# Patient Record
Sex: Male | Born: 1935 | Race: White | Hispanic: No | Smoking: Never smoker
Health system: Southern US, Community
[De-identification: ages and names within clinical notes are randomized; demographics above are authoritative.]

## PROBLEM LIST (undated history)

## (undated) DIAGNOSIS — M109 Gout, unspecified: Secondary | ICD-10-CM

## (undated) DIAGNOSIS — I10 Essential (primary) hypertension: Secondary | ICD-10-CM

## (undated) HISTORY — PX: KNEE SURGERY: SHX244

## (undated) HISTORY — PX: COLON SURGERY: SHX602

---

## 2001-11-09 ENCOUNTER — Encounter: Payer: Self-pay | Admitting: General Surgery

## 2001-11-09 ENCOUNTER — Encounter: Admission: RE | Admit: 2001-11-09 | Discharge: 2001-11-09 | Payer: Self-pay | Admitting: General Surgery

## 2001-11-15 ENCOUNTER — Inpatient Hospital Stay (HOSPITAL_COMMUNITY): Admission: RE | Admit: 2001-11-15 | Discharge: 2001-11-21 | Payer: Self-pay | Admitting: General Surgery

## 2001-11-15 ENCOUNTER — Encounter (INDEPENDENT_AMBULATORY_CARE_PROVIDER_SITE_OTHER): Payer: Self-pay | Admitting: Specialist

## 2002-03-15 ENCOUNTER — Encounter: Admission: RE | Admit: 2002-03-15 | Discharge: 2002-03-15 | Payer: Self-pay | Admitting: Occupational Medicine

## 2002-03-15 ENCOUNTER — Encounter: Payer: Self-pay | Admitting: Occupational Medicine

## 2002-07-29 ENCOUNTER — Ambulatory Visit (HOSPITAL_COMMUNITY): Admission: RE | Admit: 2002-07-29 | Discharge: 2002-07-29 | Payer: Self-pay | Admitting: *Deleted

## 2002-07-29 ENCOUNTER — Encounter: Payer: Self-pay | Admitting: *Deleted

## 2002-11-18 ENCOUNTER — Encounter: Payer: Self-pay | Admitting: Orthopedic Surgery

## 2002-11-20 ENCOUNTER — Encounter: Payer: Self-pay | Admitting: Orthopedic Surgery

## 2002-11-20 ENCOUNTER — Inpatient Hospital Stay (HOSPITAL_COMMUNITY): Admission: RE | Admit: 2002-11-20 | Discharge: 2002-11-25 | Payer: Self-pay | Admitting: Orthopedic Surgery

## 2002-12-30 ENCOUNTER — Ambulatory Visit (HOSPITAL_COMMUNITY): Admission: RE | Admit: 2002-12-30 | Discharge: 2002-12-30 | Payer: Self-pay | Admitting: Oncology

## 2003-10-01 ENCOUNTER — Emergency Department (HOSPITAL_COMMUNITY): Admission: EM | Admit: 2003-10-01 | Discharge: 2003-10-01 | Payer: Self-pay | Admitting: Family Medicine

## 2003-11-20 ENCOUNTER — Ambulatory Visit (HOSPITAL_COMMUNITY): Admission: RE | Admit: 2003-11-20 | Discharge: 2003-11-20 | Payer: Self-pay | Admitting: Oncology

## 2004-11-24 ENCOUNTER — Ambulatory Visit: Payer: Self-pay | Admitting: Oncology

## 2004-12-02 ENCOUNTER — Ambulatory Visit (HOSPITAL_COMMUNITY): Admission: RE | Admit: 2004-12-02 | Discharge: 2004-12-02 | Payer: Self-pay | Admitting: Oncology

## 2005-11-23 ENCOUNTER — Ambulatory Visit: Payer: Self-pay | Admitting: Oncology

## 2005-11-25 LAB — CBC WITH DIFFERENTIAL/PLATELET
BASO%: 0.5 % (ref 0.0–2.0)
Basophils Absolute: 0 10*3/uL (ref 0.0–0.1)
EOS%: 3 % (ref 0.0–7.0)
Eosinophils Absolute: 0.2 10*3/uL (ref 0.0–0.5)
HCT: 46.1 % (ref 38.7–49.9)
HGB: 15.9 g/dL (ref 13.0–17.1)
LYMPH%: 25.2 % (ref 14.0–48.0)
MCH: 31.6 pg (ref 28.0–33.4)
MCHC: 34.6 g/dL (ref 32.0–35.9)
MCV: 91.4 fL (ref 81.6–98.0)
MONO#: 0.5 10*3/uL (ref 0.1–0.9)
MONO%: 9.1 % (ref 0.0–13.0)
NEUT#: 3.5 10*3/uL (ref 1.5–6.5)
NEUT%: 62.2 % (ref 40.0–75.0)
Platelets: 263 10*3/uL (ref 145–400)
RBC: 5.05 10*6/uL (ref 4.20–5.71)
RDW: 14.4 % (ref 11.2–14.6)
WBC: 5.7 10*3/uL (ref 4.0–10.0)
lymph#: 1.4 10*3/uL (ref 0.9–3.3)

## 2005-11-25 LAB — COMPREHENSIVE METABOLIC PANEL
ALT: 35 U/L (ref 0–40)
AST: 24 U/L (ref 0–37)
Albumin: 4.5 g/dL (ref 3.5–5.2)
Alkaline Phosphatase: 107 U/L (ref 39–117)
BUN: 21 mg/dL (ref 6–23)
CO2: 26 mEq/L (ref 19–32)
Calcium: 9.3 mg/dL (ref 8.4–10.5)
Chloride: 104 mEq/L (ref 96–112)
Creatinine, Ser: 1.48 mg/dL (ref 0.40–1.50)
Glucose, Bld: 109 mg/dL — ABNORMAL HIGH (ref 70–99)
Potassium: 4.7 mEq/L (ref 3.5–5.3)
Sodium: 140 mEq/L (ref 135–145)
Total Bilirubin: 0.4 mg/dL (ref 0.3–1.2)
Total Protein: 7.3 g/dL (ref 6.0–8.3)

## 2005-11-25 LAB — LACTATE DEHYDROGENASE: LDH: 166 U/L (ref 94–250)

## 2005-11-25 LAB — CEA: CEA: 2.6 ng/mL (ref 0.0–5.0)

## 2005-11-25 LAB — PSA: PSA: 1.15 ng/mL (ref 0.10–4.00)

## 2005-11-28 ENCOUNTER — Ambulatory Visit (HOSPITAL_COMMUNITY): Admission: RE | Admit: 2005-11-28 | Discharge: 2005-11-28 | Payer: Self-pay | Admitting: Oncology

## 2006-11-20 ENCOUNTER — Ambulatory Visit: Payer: Self-pay | Admitting: Oncology

## 2006-11-22 LAB — CBC WITH DIFFERENTIAL/PLATELET
BASO%: 0.4 % (ref 0.0–2.0)
Basophils Absolute: 0 10*3/uL (ref 0.0–0.1)
EOS%: 3.4 % (ref 0.0–7.0)
Eosinophils Absolute: 0.2 10*3/uL (ref 0.0–0.5)
HCT: 42.6 % (ref 38.7–49.9)
HGB: 15.1 g/dL (ref 13.0–17.1)
LYMPH%: 25.6 % (ref 14.0–48.0)
MCH: 31.7 pg (ref 28.0–33.4)
MCHC: 35.5 g/dL (ref 32.0–35.9)
MCV: 89.4 fL (ref 81.6–98.0)
MONO#: 0.3 10*3/uL (ref 0.1–0.9)
MONO%: 5.4 % (ref 0.0–13.0)
NEUT#: 3.2 10*3/uL (ref 1.5–6.5)
NEUT%: 65.2 % (ref 40.0–75.0)
Platelets: 249 10*3/uL (ref 145–400)
RBC: 4.76 10*6/uL (ref 4.20–5.71)
RDW: 14.3 % (ref 11.2–14.6)
WBC: 4.9 10*3/uL (ref 4.0–10.0)
lymph#: 1.3 10*3/uL (ref 0.9–3.3)

## 2006-11-22 LAB — COMPREHENSIVE METABOLIC PANEL
ALT: 27 U/L (ref 0–53)
AST: 19 U/L (ref 0–37)
Albumin: 4.4 g/dL (ref 3.5–5.2)
Alkaline Phosphatase: 109 U/L (ref 39–117)
BUN: 20 mg/dL (ref 6–23)
CO2: 21 mEq/L (ref 19–32)
Calcium: 9 mg/dL (ref 8.4–10.5)
Chloride: 105 mEq/L (ref 96–112)
Creatinine, Ser: 1.44 mg/dL (ref 0.40–1.50)
Glucose, Bld: 156 mg/dL — ABNORMAL HIGH (ref 70–99)
Potassium: 4.3 mEq/L (ref 3.5–5.3)
Sodium: 139 mEq/L (ref 135–145)
Total Bilirubin: 0.5 mg/dL (ref 0.3–1.2)
Total Protein: 7 g/dL (ref 6.0–8.3)

## 2006-11-22 LAB — CEA: CEA: 1.7 ng/mL (ref 0.0–5.0)

## 2006-11-22 LAB — LACTATE DEHYDROGENASE: LDH: 160 U/L (ref 94–250)

## 2006-11-24 ENCOUNTER — Ambulatory Visit (HOSPITAL_COMMUNITY): Admission: RE | Admit: 2006-11-24 | Discharge: 2006-11-24 | Payer: Self-pay | Admitting: Oncology

## 2006-11-29 LAB — FECAL OCCULT BLOOD, GUAIAC: Occult Blood: NEGATIVE

## 2010-07-02 NOTE — Op Note (Signed)
NAME:  Samuel Torres, Samuel Torres                          ACCOUNT NO.:  [id number]   MEDICAL RECORD NO.:  [id number]                   PATIENT TYPE:  INP   LOCATION:  0456                                 FACILITY:  [facility]   PHYSICIAN:  Georges Lynch. Darrelyn Hillock, M.D.             DATE OF BIRTH:  January 24, 1934   DATE OF PROCEDURE:  11/20/2002  DATE OF DISCHARGE:                                 OPERATIVE REPORT   SURGEON:  Georges Lynch. Darrelyn Hillock, M.D.   ASSISTANT:  Ebbie Ridge. Paitsel, P.A.   PREOPERATIVE DIAGNOSIS:  Severe degenerative arthritis of the right knee.   POSTOPERATIVE DIAGNOSIS:  Severe degenerative arthritis of the right knee.   OPERATION:  Right total knee arthroplasty, utilizing the [employer] system.  All three components were cemented.  We used vancomycin in the cement.  He  had 1 g of IV Ancef preop.   DESCRIPTION OF PROCEDURE:  Under general anesthesia, a routine orthopedic  prep and draping of the right lower extremity was carried out.  The leg was  exsanguinated and Esmarch'd and the tourniquet was elevated at 350 mmHg.  At  this time, an incision was made down the anterior aspect of the right leg.  At this time, the incision was carried down through the subcutaneous tissue  down to the quadriceps tendon.  At this time, we created two flaps, sutured  those in place.  I then carried out a median parapatellar incision.  We  reflected the patella laterally, flexed the knee, and did medial and lateral  meniscectomies and incised the anterior and posterior cruciate ligaments.  We then removed the spurs from the patella, tibia, and femur.  Initial drill  hole was made in the intercondylar notch, and then the guide rod was  inserted, and we removed 10 mm thickness off the distal femur.  Following  this, a #2 jig was inserted for a size 9 right posterior cruciate  sacrificing femoral component, and we did our anterior, posterior, and  chamfering cuts.  Following this, we then removed 4 mm thickness  off of the  medial aspect of the tibia with the intramedullary guide rod.  Following  this, we then made our groove cut for a size 9 right posterior cruciate  sacrificing femoral component, and we did our patellar groove cut.  Once  this done, we then removed 10 mm thickness off the articular surface of the  patella for a size 26 patella.  Three drill holes then were made in the  articular surface of the patella.  We then flexed the knee and cut our keel  cut out for a size 7 tibial tray.  Following this, we thoroughly waterpicked  out the knee, dried the knee out, and cemented all three components in  simultaneously.  Once again, vancomycin was inserted in the cement.  Once  the cement was dried, we made sure we removed all loose pieces of  cement.  Prior to going through our cementing process, we did go through the  appropriate trials.  After all three components were cemented, we went  through trials once again, first with a 10 mm thickness tibial insert,  second with a 12 mm tibial insert.  The 12 mm thickness tibial insert gave  [place] the most stable contour, so we decided to use that.  We then removed  that, waterpicked the knee out, and once again searched for loose pieces of  cement.  We then inserted our permanent 12 mm thickness size 7 tibial  inserted, reduced the knee, took the knee through motion, had excellent  stability.  We then inserted a Hemovac drain, closed the knee in layers in  the usual fashion.  Skin was closed with metal staples, and a sterile  Neosporin dressing was applied.  The patient left the operating room in  satisfactory condition.                                              Ronald A. Darrelyn Hillock, M.D.   RAG/MEDQ  D:  11/20/2002  T:  11/20/2002  Job:  [id number]

## 2010-07-02 NOTE — H&P (Signed)
NAME:  Samuel Torres, Samuel Torres                          ACCOUNT NO.:  [id number]   MEDICAL RECORD NO.:  [id number]                   PATIENT TYPE:  INP   LOCATION:  NA                                   FACILITY:  [facility]   PHYSICIAN:  Georges Lynch. Darrelyn Hillock, M.D.             DATE OF BIRTH:  1933-04-19   DATE OF ADMISSION:  DATE OF DISCHARGE:                                HISTORY & PHYSICAL   ANTICIPATED DATE OF ADMISSION:  November 20, 2002.   HISTORY:  The patient had had right knee pain since he sustained a fall last  year.  At the time of the fall, he sustained a spiral fracture to his left  distal femur.  The patient had a left total knee arthroplasty in 1999.  He  has been having increasing pain in his right knee since the fall.  The  patient has been unrelieved with Vioxx.  The patient has degenerative  arthritis in his right knee and has elected to proceed with a right total  knee arthroplasty.   ALLERGIES:  None known.   PRIMARY CARE PHYSICIAN:  Oley Balm. Georgina Pillion, M.D.   PAST MEDICAL HISTORY:  1. Gastroesophageal reflux disease.  2. Degenerative arthritis.  3. Gout.  4. Kidney stones.  5. History of colon cancer.   CURRENT MEDICATIONS:  1. Atenolol 50 mg daily.  2. Lotrel 10/20 mg daily.  3. Allopurinol 300 mg daily.  4. Protonix 40 mg daily.   FAMILY HISTORY:  The patient's father had cancer, type unknown.   PAST SURGICAL HISTORY:  1. Left total knee arthroplasty February 1999.  2. Colon surgery October 2003.   REVIEW OF SYSTEMS:  GENERAL:  Denies weight changes, fever, chills, fatigue.  HEENT:  Denies headache, visual changes, tinnitus, hearing loss, or sore  throat.  CARDIOVASCULAR:  Denies chest pain, palpitations, shortness of  breath, orthopnea.  PULMONARY:  Denies dyspnea, wheezing, cough, sputum  production, hemoptysis.  GI:  Denies dysphagia, nausea, vomiting,  hematemesis, or abdominal pain. GU:  Denies dysuria, frequency, urgency, or  hematuria.  ENDOCRINE:  Denies  polyuria, polydipsia, appetite change, heat  or cold intolerance.  MUSCULOSKELETAL:  The patient does have left knee  pain.  NEUROLOGICAL:  Denies dizziness, vertigo, syncope, or seizure.  SKIN:  Denies itching, rashes, masses, or moles.   PHYSICAL EXAMINATION:  VITAL SIGNS:  Temperature 97.5, pulse 80,  respirations 18, blood pressure 110/70 right arm sitting.  GENERAL:  A 77 year old male in no acute distress.  HEENT:  PERRL, EOMs intact, pharynx clear, TMs intact.  NECK:  Supple without masses.  CHEST:  Clear to auscultation bilaterally.  No wheezing, rales, or rhonchi  noted.  HEART:  Regular rate and rhythm without murmur, gallop, or rub.  ABDOMEN:  Positive bowel sounds.  Soft, nontender.  No organomegaly or  abnormal masses.  EXTREMITIES:  Examination of his right knee reveals decreased range of  motion.  He has genu varum deformity.  SKIN:  Warm and dry.   X-ray of his right knee shows complete collapse of the medial joint space.   IMPRESSION:  Degenerative arthritis right knee.   PLAN:  The patient is to be admitted to [facility] November 20, 2002  to undergo a right total knee arthroplasty.     Ebbie Ridge. Paitsel, P.A.                     Ronald A. Darrelyn Hillock, M.D.    Tilden Dome  D:  11/18/2002  T:  11/18/2002  Job:  [id number]

## 2010-07-02 NOTE — Discharge Summary (Signed)
NAME:  ASIM, GERSTEN                          ACCOUNT NO.:  [id number]   MEDICAL RECORD NO.:  [id number]                   PATIENT TYPE:  INP   LOCATION:  0456                                 FACILITY:  [facility]   PHYSICIAN:  Georges Lynch. Darrelyn Hillock, M.D.             DATE OF BIRTH:  [birth year]   DATE OF ADMISSION:  11/20/2002  DATE OF DISCHARGE:  11/25/2002                                 DISCHARGE SUMMARY   ADMISSION DIAGNOSES:  1. Degenerative arthritis right knee.  2. Gastroesophageal reflux disease.  3. Gout.  4. Kidney stones.  5. History of colon cancer.   DISCHARGE DIAGNOSES:  1. Degenerative arthritis right knee status post right total knee     arthroplasty.  2. Gastroesophageal reflux disease.  3. Gout.  4. Kidney stones.  5. History of colon cancer.   PROCEDURE:  The patient was taken to the operating room on November 20, 2002  to undergo a right total knee arthroplasty.  Surgeon Georges Lynch. Darrelyn Hillock, M.D.  Assistant Ebbie Ridge. Paitsel, P.A.  Surgery was performed under general  anesthesia.  A Hemovac drain was placed at the time of surgery.   CONSULTS:  Physical therapy, occupational therapy.   BRIEF HISTORY:  This patient is a 77 year old male who has had right knee  pain x1 year.  He had a fall last year sustaining a spiral fracture of his  left distal femur.  The patient had a left total knee arthroplasty in 1999.  He had been having increasing pain in his right knee since the time of the  fall.  The patient's symptoms have been unrelieved with Vioxx.  The patient  has severe degenerative arthritis in his right knee and has elected to  proceed with a right total knee arthroplasty.   LABORATORY DATA:  Preadmission CBC:  WBC 5, RBC 4.80, hemoglobin 14.6,  hematocrit 42.6, platelet count 274,000.  Preadmission PT 12.8, INR 0.9, PTT  34.  Preadmission chemistry normal.  Preadmission urinalysis normal.  The  patient's blood type is A+ with negative antibody screen.   Preadmission EKG:  Normal sinus rhythm with a rate of 77.  Preadmission chest x-ray was normal.  Preadmission x-ray of right knee revealed degenerative arthritic changes  with marked medial and lateral compartment narrowing, complete loss of his  normal medial joint space.  Postoperative x-ray of right knee:  Right knee  prosthesis in expected position.  Hemoglobin and hematocrit were followed  throughout his hospitalization.  He did have a postoperative drop in his  hemoglobin to 9.4 but that stabilized prior to discharge and he did not  require blood transfusion.   HOSPITAL COURSE:  The patient was admitted to [facility] and taken  to the operating room.  He underwent the above stated procedure without  complications.  The patient tolerated the procedure well.  Was allowed to  return to the recovery room and then to  the orthopedic floor to continue his  postoperative care.  Hemovac drain was placed at time of surgery.  It was  discontinued on postoperative day one.  He was placed on PC analgesia for  pain control.  Following the surgery his PCA was kept until postoperative  day three after he was weaned over to oral analgesics.  The patient's  hemoglobin and hematocrit were followed throughout his hospitalization.  He  had a slight decrease in his hemoglobin postoperatively but that stabilized  prior to discharge.  The patient progressed very well.  Was stable to  ambulate greater than 100 feet by postoperative day five.  He was discharged  home.   DISPOSITION:  The patient was discharged home on November 25, 2002.   DISCHARGE MEDICATIONS:  1. Percocet 10/650 one to two q.6h. as needed for pain.  2. Robaxin 500 mg one q.6h. as needed for muscle spasms.  3. Trinsicon one tablet daily.  4. Coumadin 5 mg one tablet daily.  5. The patient is to resume his routine medications.   DIET:  As tolerated.   ACTIVITY:  Total knee precautions.  [facility] for home care.  Full   weightbearing with walker.   FOLLOWUP:  The patient is scheduled to follow up with Windy Fast A. Gioffre,  M.D. two weeks from the date of surgery.   CONDITION ON DISCHARGE:  Improved.     Ebbie Ridge. Paitsel, P.A.                     Ronald A. Darrelyn Hillock, M.D.    [id number]/[id number]  D:  12/18/2002  T:  12/18/2002  Job:  [id number]

## 2013-09-26 ENCOUNTER — Other Ambulatory Visit: Payer: Self-pay | Admitting: Internal Medicine

## 2013-09-26 DIAGNOSIS — N183 Chronic kidney disease, stage 3 unspecified: Secondary | ICD-10-CM

## 2013-09-27 ENCOUNTER — Ambulatory Visit
Admission: RE | Admit: 2013-09-27 | Discharge: 2013-09-27 | Disposition: A | Discharge status: Home / Self Care | Payer: Medicare HMO | Source: Ambulatory Visit | Attending: Internal Medicine | Admitting: Internal Medicine

## 2013-09-27 DIAGNOSIS — N183 Chronic kidney disease, stage 3 unspecified: Secondary | ICD-10-CM

## 2013-10-30 ENCOUNTER — Ambulatory Visit
Admission: RE | Admit: 2013-10-30 | Discharge: 2013-10-30 | Disposition: A | Discharge status: Home / Self Care | Payer: Medicare HMO | Source: Ambulatory Visit | Attending: Internal Medicine | Admitting: Internal Medicine

## 2013-10-30 ENCOUNTER — Other Ambulatory Visit: Payer: Self-pay | Admitting: Internal Medicine

## 2013-10-30 DIAGNOSIS — R05 Cough: Secondary | ICD-10-CM

## 2013-10-30 DIAGNOSIS — R059 Cough, unspecified: Secondary | ICD-10-CM

## 2013-11-24 ENCOUNTER — Encounter (HOSPITAL_COMMUNITY): Payer: Self-pay | Admitting: Emergency Medicine

## 2013-11-24 ENCOUNTER — Emergency Department (HOSPITAL_COMMUNITY)
Admission: EM | Admit: 2013-11-24 | Discharge: 2013-11-24 | Disposition: A | Discharge status: Home / Self Care | Payer: Medicare HMO | Attending: Emergency Medicine | Admitting: Emergency Medicine

## 2013-11-24 DIAGNOSIS — I10 Essential (primary) hypertension: Secondary | ICD-10-CM | POA: Insufficient documentation

## 2013-11-24 DIAGNOSIS — Z792 Long term (current) use of antibiotics: Secondary | ICD-10-CM | POA: Diagnosis not present

## 2013-11-24 DIAGNOSIS — M109 Gout, unspecified: Secondary | ICD-10-CM | POA: Insufficient documentation

## 2013-11-24 DIAGNOSIS — Z7982 Long term (current) use of aspirin: Secondary | ICD-10-CM | POA: Insufficient documentation

## 2013-11-24 DIAGNOSIS — N390 Urinary tract infection, site not specified: Secondary | ICD-10-CM | POA: Diagnosis not present

## 2013-11-24 DIAGNOSIS — Z79899 Other long term (current) drug therapy: Secondary | ICD-10-CM | POA: Insufficient documentation

## 2013-11-24 DIAGNOSIS — R3915 Urgency of urination: Secondary | ICD-10-CM | POA: Diagnosis present

## 2013-11-24 DIAGNOSIS — R339 Retention of urine, unspecified: Secondary | ICD-10-CM | POA: Diagnosis not present

## 2013-11-24 HISTORY — DX: Essential (primary) hypertension: I10

## 2013-11-24 HISTORY — DX: Gout, unspecified: M10.9

## 2013-11-24 LAB — URINALYSIS, ROUTINE W REFLEX MICROSCOPIC
Bilirubin Urine: NEGATIVE
GLUCOSE, UA: NEGATIVE mg/dL
HGB URINE DIPSTICK: NEGATIVE
KETONES UR: NEGATIVE mg/dL
Nitrite: NEGATIVE
PH: 5.5 (ref 5.0–8.0)
PROTEIN: NEGATIVE mg/dL
Specific Gravity, Urine: 1.013 (ref 1.005–1.030)
Urobilinogen, UA: 0.2 mg/dL (ref 0.0–1.0)

## 2013-11-24 LAB — CBC WITH DIFFERENTIAL/PLATELET
Basophils Absolute: 0 10*3/uL (ref 0.0–0.1)
Basophils Relative: 0 % (ref 0–1)
EOS ABS: 0.1 10*3/uL (ref 0.0–0.7)
Eosinophils Relative: 1 % (ref 0–5)
HCT: 35.2 % — ABNORMAL LOW (ref 39.0–52.0)
Hemoglobin: 12 g/dL — ABNORMAL LOW (ref 13.0–17.0)
Lymphocytes Relative: 14 % (ref 12–46)
Lymphs Abs: 1.2 10*3/uL (ref 0.7–4.0)
MCH: 31.3 pg (ref 26.0–34.0)
MCHC: 34.1 g/dL (ref 30.0–36.0)
MCV: 91.7 fL (ref 78.0–100.0)
MONOS PCT: 7 % (ref 3–12)
Monocytes Absolute: 0.6 10*3/uL (ref 0.1–1.0)
Neutro Abs: 6.4 10*3/uL (ref 1.7–7.7)
Neutrophils Relative %: 78 % — ABNORMAL HIGH (ref 43–77)
PLATELETS: 272 10*3/uL (ref 150–400)
RBC: 3.84 MIL/uL — ABNORMAL LOW (ref 4.22–5.81)
RDW: 14.7 % (ref 11.5–15.5)
WBC: 8.3 10*3/uL (ref 4.0–10.5)

## 2013-11-24 LAB — BASIC METABOLIC PANEL
Anion gap: 14 (ref 5–15)
BUN: 22 mg/dL (ref 6–23)
CALCIUM: 9.3 mg/dL (ref 8.4–10.5)
CO2: 22 mEq/L (ref 19–32)
Chloride: 95 mEq/L — ABNORMAL LOW (ref 96–112)
Creatinine, Ser: 1.49 mg/dL — ABNORMAL HIGH (ref 0.50–1.35)
GFR calc Af Amer: 50 mL/min — ABNORMAL LOW (ref >90)
GFR, EST NON AFRICAN AMERICAN: 44 mL/min — AB (ref >90)
GLUCOSE: 114 mg/dL — AB (ref 70–99)
Potassium: 4.2 mEq/L (ref 3.7–5.3)
SODIUM: 131 meq/L — AB (ref 137–147)

## 2013-11-24 LAB — URINE MICROSCOPIC-ADD ON

## 2013-11-24 MED ORDER — SODIUM CHLORIDE 0.9 % IV BOLUS (SEPSIS)
500.0000 mL | Freq: Once | INTRAVENOUS | Status: AC
  Administered 2013-11-24: 500 mL via INTRAVENOUS

## 2013-11-24 MED ORDER — SODIUM CHLORIDE 0.9 % IV BOLUS (SEPSIS)
250.0000 mL | Freq: Once | INTRAVENOUS | Status: AC
  Administered 2013-11-24: 250 mL via INTRAVENOUS

## 2013-11-24 MED ORDER — TAMSULOSIN HCL 0.4 MG PO CAPS: 0.4000 mg | ORAL_CAPSULE | Freq: Every day | ORAL | Status: AC

## 2013-11-24 MED ORDER — DEXTROSE 5 % IV SOLN
1.0000 g | Freq: Once | INTRAVENOUS | Status: AC
  Administered 2013-11-24: 1 g via INTRAVENOUS
  Filled 2013-11-24: qty 10

## 2013-11-24 NOTE — ED Notes (Signed)
Pt has attempted using urinal multiple times without success.

## 2013-11-24 NOTE — ED Provider Notes (Signed)
CSN: [id number]     Arrival date & time 11/24/13  3086 History   First MD Initiated Contact with Patient 11/24/13 1025     Chief Complaint  Patient presents with  . Urinary Urgency     (Consider location/radiation/quality/duration/timing/severity/associated sxs/prior Treatment) HPI Comments: The patient presents to the ER for evaluation of urinary symptoms. Patient reports that he developed a "kidney infection" a month ago. He was treated with antibiotics. Patient reports that the symptoms have come back this week. He was started on Keflex several days ago. He reports that he had some urgency, frequency, dysuria last night and then starting at 5 AM he has not been able to pass any urine. Has not had any fever, nausea or vomiting. He has very slight pain in the left side of his back.   Past Medical History  Diagnosis Date  . Hypertension   . Gout    Past Surgical History  Procedure Laterality Date  . Colon surgery    . Knee surgery Bilateral    History reviewed. No pertinent family history. History  Substance Use Topics  . Smoking status: Never Smoker   . Smokeless tobacco: Not on file  . Alcohol Use: No    Review of Systems  Genitourinary: Positive for dysuria, urgency, frequency and decreased urine volume. Negative for testicular pain.  All other systems reviewed and are negative.     Allergies  Review of patient's allergies indicates no known allergies.  Home Medications   Prior to Admission medications   Medication Sig Start Date End Date Taking? Authorizing Provider  acetaminophen (TYLENOL) 325 MG tablet Take 650 mg by mouth every 6 (six) hours as needed.   Yes Historical Provider, MD  allopurinol (ZYLOPRIM) 300 MG tablet Take 300 mg by mouth daily. 09/26/13  Yes Historical Provider, MD  amLODipine (NORVASC) 10 MG tablet Take 10 mg by mouth daily.   Yes Historical Provider, MD  aspirin EC 81 MG tablet Take 81 mg by mouth daily.   Yes Historical Provider, MD   atenolol (TENORMIN) 50 MG tablet Take 50 mg by mouth daily. 10/21/13  Yes Historical Provider, MD  benazepril-hydrochlorthiazide (LOTENSIN HCT) 20-12.5 MG per tablet Take 1 tablet by mouth daily. 10/21/13  Yes Historical Provider, MD  cephALEXin (KEFLEX) 500 MG capsule Take 500 mg by mouth 2 (two) times daily. 11/21/13  Yes Historical Provider, MD  pantoprazole (PROTONIX) 40 MG tablet Take 40 mg by mouth daily. 09/26/13  Yes Historical Provider, MD   BP 148/83  Pulse 103  Temp(Src) 98.4 F (36.9 C) (Oral)  Resp 16  Ht 5\' 9"  (1.753 m)  Wt 198 lb (89.812 kg)  BMI 29.23 kg/m2  SpO2 98% Physical Exam  Constitutional: He is oriented to person, place, and time. He appears well-developed and well-nourished. No distress.  HENT:  Head: Normocephalic and atraumatic.  Right Ear: Hearing normal.  Left Ear: Hearing normal.  Nose: Nose normal.  Mouth/Throat: Oropharynx is clear and moist and mucous membranes are normal.  Eyes: Conjunctivae and EOM are normal. Pupils are equal, round, and reactive to light.  Neck: Normal range of motion. Neck supple.  Cardiovascular: Regular rhythm, S1 normal and S2 normal.  Exam reveals no gallop and no friction rub.   No murmur heard. Pulmonary/Chest: Effort normal and breath sounds normal. No respiratory distress. He exhibits no tenderness.  Abdominal: Soft. Normal appearance and bowel sounds are normal. There is no hepatosplenomegaly. There is tenderness (slight) in the suprapubic area. There is no rebound,  no guarding, no tenderness at McBurney's point and negative Murphy's sign. No hernia.  Musculoskeletal: Normal range of motion.  Neurological: He is alert and oriented to person, place, and time. He has normal strength. No cranial nerve deficit or sensory deficit. Coordination normal. GCS eye subscore is 4. GCS verbal subscore is 5. GCS motor subscore is 6.  Skin: Skin is warm, dry and intact. No rash noted. No cyanosis.  Psychiatric: He has a normal mood and  affect. His speech is normal and behavior is normal. Thought content normal.    ED Course  Procedures (including critical care time) Labs Review Labs Reviewed  CBC WITH DIFFERENTIAL - Abnormal; Notable for the following:    RBC 3.84 (*)    Hemoglobin 12.0 (*)    HCT 35.2 (*)    Neutrophils Relative % 78 (*)    All other components within normal limits  BASIC METABOLIC PANEL - Abnormal; Notable for the following:    Sodium 131 (*)    Chloride 95 (*)    Glucose, Bld 114 (*)    Creatinine, Ser 1.49 (*)    GFR calc non Af Amer 44 (*)    GFR calc Af Amer 50 (*)    All other components within normal limits  URINALYSIS, ROUTINE W REFLEX MICROSCOPIC - Abnormal; Notable for the following:    Leukocytes, UA TRACE (*)    All other components within normal limits  URINE CULTURE  URINE MICROSCOPIC-ADD ON    Imaging Review No results found.   EKG Interpretation None      MDM   Final diagnoses:  None   UTI  urinary retention  Patient presented to the ER with concerns over possible urinary retention. Patient reports that he had urinary frequency through the night and was recently diagnosed with a urinary tract infection. He was started on Keflex. Since this morning, however, he has been unable to pass any urine. His bladder scan, however, showed only 45 mL b. It was felt that he was having bladder spasms secondary to infection. Was administered IV Rocephin and IV fluids. Patient has been unable to pass any urine. A second bladder scan now shows 600 mL of urine in his bladder. A Foley catheter will be placed. Patient will be started on Flomax. Follow up with urology in the office. Continue Keflex.    [name], MD 11/24/13 1655

## 2013-11-24 NOTE — Discharge Instructions (Signed)
Acute Urinary Retention °Acute urinary retention is the temporary inability to urinate. °This is a common problem in older men. As men age their prostates become larger and block the flow of urine from the bladder. This is usually a problem that has come on gradually.  °HOME CARE INSTRUCTIONS °If you are sent home with a Foley catheter and a drainage system, you will need to discuss the best course of action with your health care provider. While the catheter is in, maintain a good intake of fluids. Keep the drainage bag emptied and lower than your catheter. This is so that contaminated urine will not flow back into your bladder, which could lead to a urinary tract infection. °There are two main types of drainage bags. One is a large bag that usually is used at night. It has a good capacity that will allow you to sleep through the night without having to empty it. The second type is called a leg bag. It has a smaller capacity, so it needs to be emptied more frequently. However, the main advantage is that it can be attached by a leg strap and can go underneath your clothing, allowing you the freedom to move about or leave your home. °Only take over-the-counter or prescription medicines for pain, discomfort, or fever as directed by your health care provider.  °SEEK MEDICAL CARE IF: °· You develop a low-grade fever. °· You experience spasms or leakage of urine with the spasms. °SEEK IMMEDIATE MEDICAL CARE IF:  °· You develop chills or fever. °· Your catheter stops draining urine. °· Your catheter falls out. °· You start to develop increased bleeding that does not respond to rest and increased fluid intake. °MAKE SURE YOU: °· Understand these instructions. °· Will watch your condition. °· Will get help right away if you are not doing well or get worse. °Document Released: 05/09/2000 Document Revised: 02/05/2013 Document Reviewed: 07/12/2012 °ExitCare® Patient Information ©2015 [employer]. This information is not  intended to replace advice given to you by your health care provider. Make sure you discuss any questions you have with your health care provider. °Urinary Tract Infection °Urinary tract infections (UTIs) can develop anywhere along your urinary tract. Your urinary tract is your body's drainage system for removing wastes and extra water. Your urinary tract includes two kidneys, two ureters, a bladder, and a urethra. Your kidneys are a pair of bean-shaped organs. Each kidney is about the size of your fist. They are located below your ribs, one on each side of your spine. °CAUSES °Infections are caused by microbes, which are microscopic organisms, including fungi, viruses, and bacteria. These organisms are so small that they can only be seen through a microscope. Bacteria are the microbes that most commonly cause UTIs. °SYMPTOMS  °Symptoms of UTIs may vary by age and gender of the patient and by the location of the infection. Symptoms in young women typically include a frequent and intense urge to urinate and a painful, burning feeling in the bladder or urethra during urination. Older women and men are more likely to be tired, shaky, and weak and have muscle aches and abdominal pain. A fever may mean the infection is in your kidneys. Other symptoms of a kidney infection include pain in your back or sides below the ribs, nausea, and vomiting. °DIAGNOSIS °To diagnose a UTI, your caregiver will ask you about your symptoms. Your caregiver also will ask to provide a urine sample. The urine sample will be tested for bacteria and white blood   cells. White blood cells are made by your body to help fight infection. °TREATMENT  °Typically, UTIs can be treated with medication. Because most UTIs are caused by a bacterial infection, they usually can be treated with the use of antibiotics. The choice of antibiotic and length of treatment depend on your symptoms and the type of bacteria causing your infection. °HOME CARE  INSTRUCTIONS °· If you were prescribed antibiotics, take them exactly as your caregiver instructs you. Finish the medication even if you feel better after you have only taken some of the medication. °· Drink enough water and fluids to keep your urine clear or pale yellow. °· Avoid caffeine, tea, and carbonated beverages. They tend to irritate your bladder. °· Empty your bladder often. Avoid holding urine for long periods of time. °· Empty your bladder before and after sexual intercourse. °· After a bowel movement, women should cleanse from front to back. Use each tissue only once. °SEEK MEDICAL CARE IF:  °· You have back pain. °· You develop a fever. °· Your symptoms do not begin to resolve within 3 days. °SEEK IMMEDIATE MEDICAL CARE IF:  °· You have severe back pain or lower abdominal pain. °· You develop chills. °· You have nausea or vomiting. °· You have continued burning or discomfort with urination. °MAKE SURE YOU:  °· Understand these instructions. °· Will watch your condition. °· Will get help right away if you are not doing well or get worse. °Document Released: 11/10/2004 Document Revised: 08/02/2011 Document Reviewed: 03/11/2011 °ExitCare® Patient Information ©2015 [employer]. This information is not intended to replace advice given to you by your health care provider. Make sure you discuss any questions you have with your health care provider. ° °

## 2013-11-24 NOTE — ED Notes (Signed)
Last night up to urinate frequently all night long; recent bladder infection, currently on antibiotics; small amounts of urine coming out each time. Denies fever, N/V. Reports some back pain on left side.

## 2013-11-24 NOTE — ED Notes (Addendum)
Bladder scan shows 61ml in bladder. Reported to Dr. Waverly Ferrari.

## 2013-11-24 NOTE — ED Notes (Signed)
Follow up and medications reviewed with pt. Pt verbalized understanding.

## 2013-11-26 LAB — URINE CULTURE
CULTURE: NO GROWTH
Colony Count: NO GROWTH

## 2014-02-26 ENCOUNTER — Other Ambulatory Visit (INDEPENDENT_AMBULATORY_CARE_PROVIDER_SITE_OTHER): Payer: Self-pay | Admitting: General Surgery

## 2014-02-26 ENCOUNTER — Other Ambulatory Visit (HOSPITAL_COMMUNITY): Payer: Self-pay | Admitting: *Deleted

## 2014-02-26 NOTE — Pre-Procedure Instructions (Addendum)
Samuel Torres  02/26/2014   Your procedure is scheduled on:  Friday, March 07, 2014 at 9:30 AM.   Report to [facility] Entrance "A" Admitting Office at 7:30 AM.   Call this number if you have problems the morning of surgery: [phone]               Any questions prior to day of surgery, please call [phone] between 8 & 4 PM    Remember:   Do not eat food or drink liquids after midnight Thursday,03/06/14.    Stop aspirin as of today.    Take these medicines the morning of surgery with A SIP OF WATER: acetaminophen (TYLENOL),  allopurinol (ZYLOPRIM), amLODipine (NORVASC), atenolol (TENORMIN) , pantoprazole  (PROTONIX), tamsulosin (FLOMAX)   DISCONTINUE ASPIRIN, COUMADIN, EFFIENT, HERBAL MEDICATIONS 5 DAYS PRIOR TO  SURGERY    Do not wear jewelry.  Do not wear lotions, powders, or cologne. You may wear deodorant.  Men may shave face and neck.  Do not bring valuables to the hospital.  [facility] is not responsible for any belongings or  valuables.               Contacts, dentures or bridgework may not be worn into surgery.  Leave suitcase in the car. After surgery it may be brought to your room.  For patients admitted to the hospital, discharge time is determined by your  treatment team.               Patients discharged the day of surgery will not be allowed to drive home.    Special Instructions: See "Preparing for Surgery" Instruction Sheet   Please read over the following fact sheets that you were given: Pain Booklet, Coughing and Deep Breathing and Surgical Site Infection Prevention

## 2014-02-26 NOTE — Progress Notes (Signed)
No pre-op orders in EPIC. Called Dr. Ethlyn Gallery office and spoke with Kenney Houseman, she states she will send Dr. Marlou Starks a message.

## 2014-02-27 ENCOUNTER — Encounter (HOSPITAL_COMMUNITY)
Admission: RE | Admit: 2014-02-27 | Discharge: 2014-02-27 | Disposition: A | Discharge status: Home / Self Care | Payer: Medicare HMO | Source: Ambulatory Visit | Attending: General Surgery | Admitting: General Surgery

## 2014-02-27 ENCOUNTER — Encounter (HOSPITAL_COMMUNITY): Payer: Self-pay

## 2014-02-27 DIAGNOSIS — Z01812 Encounter for preprocedural laboratory examination: Secondary | ICD-10-CM | POA: Diagnosis present

## 2014-02-27 DIAGNOSIS — K4090 Unilateral inguinal hernia, without obstruction or gangrene, not specified as recurrent: Secondary | ICD-10-CM | POA: Insufficient documentation

## 2014-02-27 DIAGNOSIS — Z01818 Encounter for other preprocedural examination: Secondary | ICD-10-CM | POA: Diagnosis not present

## 2014-02-27 LAB — CBC
HCT: 42.4 % (ref 39.0–52.0)
Hemoglobin: 14.3 g/dL (ref 13.0–17.0)
MCH: 31 pg (ref 26.0–34.0)
MCHC: 33.7 g/dL (ref 30.0–36.0)
MCV: 92 fL (ref 78.0–100.0)
PLATELETS: 192 10*3/uL (ref 150–400)
RBC: 4.61 MIL/uL (ref 4.22–5.81)
RDW: 14.3 % (ref 11.5–15.5)
WBC: 5.8 10*3/uL (ref 4.0–10.5)

## 2014-02-27 LAB — URINALYSIS, ROUTINE W REFLEX MICROSCOPIC
Bilirubin Urine: NEGATIVE
Glucose, UA: NEGATIVE mg/dL
Hgb urine dipstick: NEGATIVE
KETONES UR: NEGATIVE mg/dL
Leukocytes, UA: NEGATIVE
Nitrite: NEGATIVE
PH: 5.5 (ref 5.0–8.0)
PROTEIN: NEGATIVE mg/dL
SPECIFIC GRAVITY, URINE: 1.012 (ref 1.005–1.030)
UROBILINOGEN UA: 0.2 mg/dL (ref 0.0–1.0)

## 2014-02-27 LAB — BASIC METABOLIC PANEL
ANION GAP: 17 — AB (ref 5–15)
BUN: 14 mg/dL (ref 6–23)
CO2: 24 mmol/L (ref 19–32)
CREATININE: 1.62 mg/dL — AB (ref 0.50–1.35)
Calcium: 9.6 mg/dL (ref 8.4–10.5)
Chloride: 98 mEq/L (ref 96–112)
GFR, EST AFRICAN AMERICAN: 45 mL/min — AB (ref >90)
GFR, EST NON AFRICAN AMERICAN: 39 mL/min — AB (ref >90)
Glucose, Bld: 118 mg/dL — ABNORMAL HIGH (ref 70–99)
Potassium: 4.6 mmol/L (ref 3.5–5.1)
SODIUM: 139 mmol/L (ref 135–145)

## 2014-02-27 MED ORDER — CHLORHEXIDINE GLUCONATE 4 % EX LIQD: 1.0000 "application " | Freq: Once | CUTANEOUS | Status: DC

## 2014-02-27 NOTE — Progress Notes (Signed)
Requested EKG from Dr Shelia Media office.  Samuel Torres to fax document to [phone]

## 2014-02-27 NOTE — Progress Notes (Signed)
This patient scored at an elevated risk for obstructive sleep apnea using the STOP BANG TOOL during a presurgical testing,

## 2014-02-28 NOTE — Progress Notes (Signed)
Anesthesia Chart Review:  Pt is 81 year old male scheduled for L incarcerated inguinal hernia repair with mesh on 03/07/2014 with Dr. Marlou Starks.   PMH includes: HTN  Preoperative labs reviewed.  Cr 1.62. Was 1.49 11/2013, 1.44-1.48 in 2007, 2008.   Chest x-ray 10/30/2013 reviewed. No active cardiopulmonary disease.   EKG 11/27/2013: Sinus rhythm. Lateral T wave changes, consider ischemia.   Discussed with Dr. Oletta Lamas.  If no changes, I anticipate pt can proceed with surgery as scheduled.   Willeen Cass, FNP-BC [facility]/Anesthesiology Phone: [phone] 02/28/2014 4:46 PM

## 2014-03-06 MED ORDER — CEFAZOLIN SODIUM-DEXTROSE 2-3 GM-% IV SOLR
2.0000 g | INTRAVENOUS | Status: AC
  Administered 2014-03-07: 2 g via INTRAVENOUS
  Filled 2014-03-06: qty 50

## 2014-03-07 ENCOUNTER — Ambulatory Visit (HOSPITAL_COMMUNITY): Payer: Medicare HMO | Admitting: Anesthesiology

## 2014-03-07 ENCOUNTER — Encounter (HOSPITAL_COMMUNITY)
Admission: RE | Disposition: A | Discharge status: Home / Self Care | Payer: Self-pay | Source: Ambulatory Visit | Attending: General Surgery

## 2014-03-07 ENCOUNTER — Ambulatory Visit (HOSPITAL_COMMUNITY): Payer: Medicare HMO | Admitting: Emergency Medicine

## 2014-03-07 ENCOUNTER — Ambulatory Visit (HOSPITAL_COMMUNITY)
Admission: RE | Admit: 2014-03-07 | Discharge: 2014-03-07 | Disposition: A | Discharge status: Home / Self Care | Payer: Medicare HMO | Source: Ambulatory Visit | Attending: General Surgery | Admitting: General Surgery

## 2014-03-07 ENCOUNTER — Encounter (HOSPITAL_COMMUNITY): Payer: Self-pay | Admitting: *Deleted

## 2014-03-07 DIAGNOSIS — R339 Retention of urine, unspecified: Secondary | ICD-10-CM | POA: Insufficient documentation

## 2014-03-07 DIAGNOSIS — K4030 Unilateral inguinal hernia, with obstruction, without gangrene, not specified as recurrent: Secondary | ICD-10-CM | POA: Insufficient documentation

## 2014-03-07 DIAGNOSIS — Z85038 Personal history of other malignant neoplasm of large intestine: Secondary | ICD-10-CM | POA: Diagnosis not present

## 2014-03-07 DIAGNOSIS — I10 Essential (primary) hypertension: Secondary | ICD-10-CM | POA: Insufficient documentation

## 2014-03-07 DIAGNOSIS — K219 Gastro-esophageal reflux disease without esophagitis: Secondary | ICD-10-CM | POA: Diagnosis not present

## 2014-03-07 HISTORY — PX: INSERTION OF MESH: SHX5868

## 2014-03-07 HISTORY — PX: INGUINAL HERNIA REPAIR: SHX194

## 2014-03-07 SURGERY — REPAIR, HERNIA, INGUINAL, INCARCERATED
Anesthesia: General | Laterality: Left

## 2014-03-07 MED ORDER — VECURONIUM BROMIDE 10 MG IV SOLR
INTRAVENOUS | Status: DC | PRN
  Administered 2014-03-07: 5 mg via INTRAVENOUS
  Administered 2014-03-07: 2 mg via INTRAVENOUS

## 2014-03-07 MED ORDER — ONDANSETRON HCL 4 MG/2ML IJ SOLN
INTRAMUSCULAR | Status: AC
  Filled 2014-03-07: qty 2

## 2014-03-07 MED ORDER — GLYCOPYRROLATE 0.2 MG/ML IJ SOLN
INTRAMUSCULAR | Status: AC
  Filled 2014-03-07: qty 2

## 2014-03-07 MED ORDER — DEXTROSE 5 % IV SOLN
10.0000 mg | INTRAVENOUS | Status: DC | PRN
  Administered 2014-03-07: 25 ug/min via INTRAVENOUS

## 2014-03-07 MED ORDER — FENTANYL CITRATE 0.05 MG/ML IJ SOLN
25.0000 ug | INTRAMUSCULAR | Status: DC | PRN
  Administered 2014-03-07: 50 ug via INTRAVENOUS

## 2014-03-07 MED ORDER — MIDAZOLAM HCL 2 MG/2ML IJ SOLN
1.0000 mg | INTRAMUSCULAR | Status: DC | PRN
  Administered 2014-03-07: 1 mg via INTRAVENOUS

## 2014-03-07 MED ORDER — PROPOFOL 10 MG/ML IV BOLUS
INTRAVENOUS | Status: AC
Start: 2014-03-07 — End: 2014-03-07
  Filled 2014-03-07: qty 20

## 2014-03-07 MED ORDER — FENTANYL CITRATE 0.05 MG/ML IJ SOLN
50.0000 ug | INTRAMUSCULAR | Status: DC | PRN
  Administered 2014-03-07: 50 ug via INTRAVENOUS

## 2014-03-07 MED ORDER — LACTATED RINGERS IV SOLN
INTRAVENOUS | Status: DC
  Administered 2014-03-07: 08:00:00 via INTRAVENOUS

## 2014-03-07 MED ORDER — LIDOCAINE HCL (CARDIAC) 20 MG/ML IV SOLN
INTRAVENOUS | Status: AC
  Filled 2014-03-07: qty 5

## 2014-03-07 MED ORDER — PROPOFOL 10 MG/ML IV BOLUS
INTRAVENOUS | Status: DC | PRN
  Administered 2014-03-07: 170 mg via INTRAVENOUS

## 2014-03-07 MED ORDER — PHENYLEPHRINE HCL 10 MG/ML IJ SOLN
INTRAMUSCULAR | Status: DC | PRN
  Administered 2014-03-07 (×2): 80 ug via INTRAVENOUS
  Administered 2014-03-07 (×2): 120 ug via INTRAVENOUS

## 2014-03-07 MED ORDER — NEOSTIGMINE METHYLSULFATE 10 MG/10ML IV SOLN
INTRAVENOUS | Status: AC
  Filled 2014-03-07: qty 1

## 2014-03-07 MED ORDER — GLYCOPYRROLATE 0.2 MG/ML IJ SOLN
INTRAMUSCULAR | Status: AC
  Filled 2014-03-07: qty 3

## 2014-03-07 MED ORDER — OXYCODONE-ACETAMINOPHEN 5-325 MG PO TABS: 1.0000 | ORAL_TABLET | ORAL | Status: AC | PRN

## 2014-03-07 MED ORDER — BUPIVACAINE-EPINEPHRINE 0.25% -1:200000 IJ SOLN
INTRAMUSCULAR | Status: DC | PRN
  Administered 2014-03-07: 30 mL

## 2014-03-07 MED ORDER — FENTANYL CITRATE 0.05 MG/ML IJ SOLN
INTRAMUSCULAR | Status: AC
  Filled 2014-03-07: qty 2

## 2014-03-07 MED ORDER — ONDANSETRON HCL 4 MG/2ML IJ SOLN: 4.0000 mg | Freq: Once | INTRAMUSCULAR | Status: DC | PRN

## 2014-03-07 MED ORDER — LIDOCAINE HCL (CARDIAC) 20 MG/ML IV SOLN
INTRAVENOUS | Status: DC | PRN
  Administered 2014-03-07: 40 mg via INTRAVENOUS

## 2014-03-07 MED ORDER — NEOSTIGMINE METHYLSULFATE 10 MG/10ML IV SOLN
INTRAVENOUS | Status: DC | PRN
  Administered 2014-03-07: 4 mg via INTRAVENOUS

## 2014-03-07 MED ORDER — 0.9 % SODIUM CHLORIDE (POUR BTL) OPTIME
TOPICAL | Status: DC | PRN
  Administered 2014-03-07: 1000 mL

## 2014-03-07 MED ORDER — OXYCODONE HCL 5 MG PO TABS: 5.0000 mg | ORAL_TABLET | Freq: Once | ORAL | Status: DC | PRN

## 2014-03-07 MED ORDER — FENTANYL CITRATE 0.05 MG/ML IJ SOLN
INTRAMUSCULAR | Status: AC
  Filled 2014-03-07: qty 5

## 2014-03-07 MED ORDER — ALBUMIN HUMAN 5 % IV SOLN
INTRAVENOUS | Status: DC | PRN
  Administered 2014-03-07: 10:00:00 via INTRAVENOUS

## 2014-03-07 MED ORDER — FENTANYL CITRATE 0.05 MG/ML IJ SOLN
INTRAMUSCULAR | Status: DC | PRN
  Administered 2014-03-07: 50 ug via INTRAVENOUS

## 2014-03-07 MED ORDER — GLYCOPYRROLATE 0.2 MG/ML IJ SOLN
INTRAMUSCULAR | Status: AC
  Filled 2014-03-07: qty 1

## 2014-03-07 MED ORDER — MIDAZOLAM HCL 2 MG/2ML IJ SOLN
INTRAMUSCULAR | Status: DC
Start: 2014-03-07 — End: 2014-03-07
  Filled 2014-03-07: qty 2

## 2014-03-07 MED ORDER — LACTATED RINGERS IV SOLN
INTRAVENOUS | Status: DC | PRN
  Administered 2014-03-07 (×2): via INTRAVENOUS

## 2014-03-07 MED ORDER — OXYCODONE HCL 5 MG/5ML PO SOLN: 5.0000 mg | Freq: Once | ORAL | Status: DC | PRN

## 2014-03-07 MED ORDER — GLYCOPYRROLATE 0.2 MG/ML IJ SOLN
INTRAMUSCULAR | Status: DC | PRN
  Administered 2014-03-07: .5 mg via INTRAVENOUS

## 2014-03-07 MED ORDER — BUPIVACAINE-EPINEPHRINE (PF) 0.25% -1:200000 IJ SOLN
INTRAMUSCULAR | Status: AC
  Filled 2014-03-07: qty 30

## 2014-03-07 MED ORDER — ONDANSETRON HCL 4 MG/2ML IJ SOLN
INTRAMUSCULAR | Status: DC | PRN
  Administered 2014-03-07: 4 mg via INTRAVENOUS

## 2014-03-07 MED ORDER — FENTANYL CITRATE 0.05 MG/ML IJ SOLN
INTRAMUSCULAR | Status: AC
  Administered 2014-03-07: 50 ug via INTRAVENOUS
  Filled 2014-03-07: qty 2

## 2014-03-07 MED ORDER — ROCURONIUM BROMIDE 50 MG/5ML IV SOLN
INTRAVENOUS | Status: AC
  Filled 2014-03-07: qty 1

## 2014-03-07 SURGICAL SUPPLY — 39 items
BLADE SURG ROTATE 9660 (MISCELLANEOUS) IMPLANT
CHLORAPREP W/TINT 26ML (MISCELLANEOUS) ×2 IMPLANT
COVER SURGICAL LIGHT HANDLE (MISCELLANEOUS) ×2 IMPLANT
DRAIN PENROSE 1/2X12 LTX STRL (WOUND CARE) ×2 IMPLANT
DRAPE LAPAROSCOPIC ABDOMINAL (DRAPES) ×2 IMPLANT
ELECT CAUTERY BLADE 6.4 (BLADE) ×2 IMPLANT
ELECT REM PT RETURN 9FT ADLT (ELECTROSURGICAL) ×2
ELECTRODE REM PT RTRN 9FT ADLT (ELECTROSURGICAL) ×1 IMPLANT
GLOVE BIO SURGEON STRL SZ 6.5 (GLOVE) ×2 IMPLANT
GLOVE BIO SURGEON STRL SZ7 (GLOVE) ×2 IMPLANT
GLOVE BIO SURGEON STRL SZ7.5 (GLOVE) ×2 IMPLANT
GLOVE BIOGEL PI IND STRL 7.0 (GLOVE) ×3 IMPLANT
GLOVE BIOGEL PI INDICATOR 7.0 (GLOVE) ×3
GOWN STRL REUS W/ TWL LRG LVL3 (GOWN DISPOSABLE) ×2 IMPLANT
GOWN STRL REUS W/TWL LRG LVL3 (GOWN DISPOSABLE) ×2
KIT BASIN OR (CUSTOM PROCEDURE TRAY) ×2 IMPLANT
KIT ROOM TURNOVER OR (KITS) ×2 IMPLANT
LIQUID BAND (GAUZE/BANDAGES/DRESSINGS) ×2 IMPLANT
MESH ULTRAPRO 3X6 7.6X15CM (Mesh General) ×2 IMPLANT
NEEDLE HYPO 25GX1X1/2 BEV (NEEDLE) ×2 IMPLANT
NS IRRIG 1000ML POUR BTL (IV SOLUTION) ×2 IMPLANT
PACK GENERAL/GYN (CUSTOM PROCEDURE TRAY) ×2 IMPLANT
PAD ARMBOARD 7.5X6 YLW CONV (MISCELLANEOUS) ×2 IMPLANT
STAPLER VISISTAT 35W (STAPLE) ×2 IMPLANT
SUT MNCRL AB 4-0 PS2 18 (SUTURE) ×2 IMPLANT
SUT PROLENE 2 0 SH DA (SUTURE) ×6 IMPLANT
SUT SILK 2 0 SH (SUTURE) IMPLANT
SUT SILK 3 0 (SUTURE) ×1
SUT SILK 3-0 18XBRD TIE 12 (SUTURE) ×1 IMPLANT
SUT VIC AB 0 CT1 27 (SUTURE) ×3
SUT VIC AB 0 CT1 27XBRD ANBCTR (SUTURE) ×3 IMPLANT
SUT VIC AB 2-0 SH 27 (SUTURE) ×1
SUT VIC AB 2-0 SH 27X BRD (SUTURE) ×1 IMPLANT
SUT VIC AB 3-0 SH 27 (SUTURE) ×1
SUT VIC AB 3-0 SH 27XBRD (SUTURE) ×1 IMPLANT
SYR BULB 3OZ (MISCELLANEOUS) ×2 IMPLANT
SYR CONTROL 10ML LL (SYRINGE) ×2 IMPLANT
TOWEL OR 17X24 6PK STRL BLUE (TOWEL DISPOSABLE) ×2 IMPLANT
TOWEL OR 17X26 10 PK STRL BLUE (TOWEL DISPOSABLE) ×2 IMPLANT

## 2014-03-07 NOTE — Interval H&P Note (Signed)
**Note Samuel-Identified via Obfuscation** History and Physical Interval Note:  03/07/2014 8:49 AM  Samuel Torres  has presented today for surgery, with the diagnosis of Incarcerated Left Inguinal Hernia  The various methods of treatment have been discussed with the patient and family. After consideration of risks, benefits and other options for treatment, the patient has consented to  Procedure(s): LEFT INCACERATED INGUINAL HERNIA REPAIR WITH MESH  (Left) INSERTION OF MESH (Left) as a surgical intervention .  The patient's history has been reviewed, patient examined, no change in status, stable for surgery.  I have reviewed the patient's chart and labs.  Questions were answered to the patient's satisfaction.     TOTH III,Sostenes Kauffmann S

## 2014-03-07 NOTE — Op Note (Signed)
**Note Samuel-Identified via Obfuscation** 03/07/2014  11:33 AM  PATIENT:  Samuel Torres  81 y.o. male  PRE-OPERATIVE DIAGNOSIS:  Incarcerated Left Inguinal Hernia  POST-OPERATIVE DIAGNOSIS:  Incarcerated Left Inguinal Hernia  PROCEDURE:  Procedure(s): LEFT INCARCERATED INGUINAL HERNIA REPAIR WITH MESH  (Left) INSERTION OF MESH (Left)  SURGEON:  Surgeon(s) and Role:    * Jovita Kussmaul, MD - Primary  PHYSICIAN ASSISTANT:   ASSISTANTS: none   ANESTHESIA:   general  EBL:  Total I/O In: 1250 [I.V.:1000; IV Piggyback:250] Out: -   BLOOD ADMINISTERED:none  DRAINS: none   LOCAL MEDICATIONS USED:  MARCAINE     SPECIMEN:  No Specimen  DISPOSITION OF SPECIMEN:  N/A  COUNTS:  YES  TOURNIQUET:  * No tourniquets in log *  DICTATION: .Dragon Dictation  After informed consent was obtained the patient was brought to the operating room and placed in the supine position on the operating room table. After adequate induction of general anesthesia the patient's abdomen, left groin, and genital area were prepped with Betadine and draped in usual sterile manner. The left groin area was infiltrated with quarter percent Marcaine. An incision was made from the edge of the pubic tubercle on the left towards the anterior superior iliac spine with the 15 blade knife. This incision was carried through the skin and subcutaneous tissue sharply with electrocautery until the fascia of the external oblique was encountered. The fascia of the external oblique was opened along its fibers towards the apex of the external ring. A week then retractor was deployed. Once this was accomplished we were able to finally get the hernia to reduce with constant gentle pressure. The cord structures were then gently skeletonized. There was a lot of scar tissue associated with the cord because of the very large hernia that had been incarcerated. We were able to identify the hernia sac and reduce it. The floor of the canal was then repaired with multiple interrupted 0  Vicryl stitches. The vas deferens and testicular artery were identified and it appeared as though we were able to spare these structures. Next a 3 x 6 piece of ultra Pro mesh was chosen and cut to fit. The mesh was sewn inferiorly to the shelving edge of the inguinal ligament with a running 2-0 Prolene stitch. Superiorly the mesh was sewed to the musculoaponeurotic strength of the transversalis muscle with interrupted 2-0 Prolene vertical mattress stitches. The ilioinguinal nerve was identified during the dissection and it was dissected free proximally and distally, clamped with hemostats, divided and ligated with 3-0 silk ties. Tails were cut in the mesh laterally and the tails were wrapped around the cord structures. Lateral to the cord the mesh was anchored to the shelving edge of the inguinal ligament with interrupted 2-0 Prolene stitches. Once this was accomplished the mesh appeared to be in good position and the hernia appeared to be well repaired. The testicle was placed back in the scrotum. The external oblique fascia was then reapproximated with a running 2-0 Vicryl stitch. The wound was then infiltrated with local Marcaine. The subcutaneous fascia was closed with a running 3-0 Vicryl stitch. The skin was closed with a running 4-0 Monocryl subcuticular stitch. Dermabond dressings were applied. The patient tolerated the procedure well. At the end of the case all needle sponge and instrument counts were correct. The patient was then awakened and taken to recovery in stable condition. The patient's testicle was back in the scrotum at the end of the case.  PLAN OF CARE: Discharge to  home after PACU  PATIENT DISPOSITION:  PACU - hemodynamically stable.   Delay start of Pharmacological VTE agent (>24hrs) due to surgical blood loss or risk of bleeding: not applicable

## 2014-03-07 NOTE — H&P (Signed)
Samuel Torres 12/18/2013 9:16 AM Location: [facility] Patient #: [id number] DOB: 1933-03-05 Widowed / Language: Samuel Torres / Race: White Male  History of Present Illness Samuel Torres. Marlou Starks MD; 12/18/2013 11:25 AM) Patient words: left inguinal hernia.  The patient is a 81 year old male who presents with an inguinal hernia. We are asked to see the patient in consultation by Dr. Shelia Media to evaluate him for a left inguinal hernia. The patient is a 81 year old white male who has known of a left inguinal hernia for a couple of years. He used to be able to reduce it easily but over the last few months has not been able to push it back in. He denies any pain. He denies any nausea or vomiting. His appetite is good and his bowels are working normally. His biggest problem right now is persistent urinary retention. He is seeing Dr. Alinda Money in urology for this. He currently has an indwelling Foley catheter.   Other Problems Samuel Torres, CMA; 12/18/2013 9:16 AM) Arthritis Bladder Problems Colon Cancer Gastroesophageal Reflux Disease High blood pressure Inguinal Hernia Other disease, cancer, significant illness  Past Surgical History Samuel Torres, [place]; 12/18/2013 9:16 AM) Knee Surgery Bilateral. Tonsillectomy  Diagnostic Studies History Samuel Torres, [place]; 12/18/2013 9:16 AM) Colonoscopy 1-5 years ago  Allergies Samuel Torres, Samuel Torres; 12/18/2013 9:17 AM) No Known Drug Allergies11/05/2013  Medication History Samuel Torres, CMA; 12/18/2013 9:22 AM) Tylenol (325MG  Tablet, Oral) Active. Allopurinol (300MG  Tablet, Oral) Active. AmLODIPine Besylate (10MG  Tablet, Oral) Active. Aspirin EC (81MG  Tablet DR, Oral) Active. Atenolol (50MG  Tablet, Oral) Active. Benazepril-Hydrochlorothiazide (20-12.5MG  Tablet, Oral) Active. Pantoprazole Sodium (40MG  Tablet DR, Oral) Active. Tamsulosin HCl (0.4MG  Capsule, Oral) Active. Saw Palmetto (450MG  Capsule, Oral)  Active. Multiple Vitamins (Oral) Active. Keflex (500MG  Capsule, Oral) Active. Ranitidine HCl (150MG  Capsule, Oral) Active. Benzonatate (200MG  Capsule, Oral) Active.  Social History Samuel Torres, [place]; 12/18/2013 9:16 AM) Alcohol use Occasional alcohol use. Caffeine use Coffee, Tea. No drug use Tobacco use Never smoker.  Family History Samuel Torres, [place]; 12/18/2013 9:16 AM) Arthritis Daughter, Father, Son. Depression Daughter, Son. Hypertension Daughter. Migraine Headache Daughter, Son. Respiratory Condition Father.  Review of Systems ([place]. Brooks CMA; 12/18/2013 9:16 AM) General Not Present- Appetite Loss, Chills, Fatigue, Fever, Night Sweats, Weight Gain and Weight Loss. Skin Not Present- Change in Wart/Mole, Dryness, Hives, Jaundice, New Lesions, Non-Healing Wounds, Rash and Ulcer. HEENT Present- Hoarseness, Seasonal Allergies and Wears glasses/contact lenses. Not Present- Earache, Hearing Loss, Nose Bleed, Oral Ulcers, Ringing in the Ears, Sinus Pain, Sore Throat, Visual Disturbances and Yellow Eyes. Respiratory Present- Chronic Cough and Snoring. Not Present- Bloody sputum, Difficulty Breathing and Wheezing. Gastrointestinal Present- Indigestion. Not Present- Abdominal Pain, Bloating, Bloody Stool, Change in Bowel Habits, Chronic diarrhea, Constipation, Difficulty Swallowing, Excessive gas, Gets full quickly at meals, Hemorrhoids, Nausea, Rectal Pain and Vomiting. Male Genitourinary Present- Change in Urinary Stream, Painful Urination and Urgency. Not Present- Blood in Urine, Frequency, Impotence, Nocturia and Urine Leakage. Musculoskeletal Present- Joint Pain. Not Present- Back Pain, Joint Stiffness, Muscle Pain, Muscle Weakness and Swelling of Extremities.   Vitals [employer] R. Brooks CMA; 12/18/2013 9:17 AM) 12/18/2013 9:17 AM Weight: 189 lb Height: 69in Body Surface Area: 2.04 m Body Mass Index: 27.91 kg/m Pulse: 78 (Regular)  Resp.: 14  (Unlabored)  BP: 124/78 (Sitting, Left Arm, Standard)    Physical Exam [name] S. Marlou Starks MD; 12/18/2013 11:26 AM) General Mental Status-Alert. General Appearance-Consistent with stated age. Hydration-Well hydrated. Voice-Normal.  Head and Neck  Head-normocephalic, atraumatic with no lesions or palpable masses. Trachea-midline. Thyroid Gland Characteristics - normal size and consistency.  Eye Eyeball - Bilateral-Extraocular movements intact. Sclera/Conjunctiva - Bilateral-No scleral icterus.  Chest and Lung Exam Chest and lung exam reveals -quiet, even and easy respiratory effort with no use of accessory muscles and on auscultation, normal breath sounds, no adventitious sounds and normal vocal resonance. Inspection Chest Wall - Normal. Back - normal.  Cardiovascular Cardiovascular examination reveals -normal heart sounds, regular rate and rhythm with no murmurs and normal pedal pulses bilaterally.  Abdomen Inspection Inspection of the abdomen reveals - No Hernias. Skin - Scar - no surgical scars. Palpation/Percussion Palpation and Percussion of the abdomen reveal - Soft, Non Tender, No Rebound tenderness, No Rigidity (guarding) and No hepatosplenomegaly. Auscultation Auscultation of the abdomen reveals - Bowel sounds normal.  Male Genitourinary Note: The patient has an indwelling Foley catheter. There is a large bulge in the left groin that does not reduce. There is no abdominal distention. There is no tenderness.   Neurologic Neurologic evaluation reveals -alert and oriented x 3 with no impairment of recent or remote memory. Mental Status-Normal.  Musculoskeletal Normal Exam - Left-Upper Extremity Strength Normal and Lower Extremity Strength Normal. Normal Exam - Right-Upper Extremity Strength Normal and Lower Extremity Strength Normal.  Lymphatic Head & Neck  General Head & Neck Lymphatics: Bilateral - Description -  Normal. Axillary  General Axillary Region: Bilateral - Description - Normal. Tenderness - Non Tender. Femoral & Inguinal  Generalized Femoral & Inguinal Lymphatics: Bilateral - Description - Normal. Tenderness - Non Tender.    Assessment & Plan [name] S. Marlou Starks MD; 12/18/2013 11:27 AM) Nira Conn LEFT INGUINAL HERNIA (550.10  K40.30) Impression: The patient has an incarcerated left inguinal hernia that no evidence of obstruction. Because of the risk of strangulation of think he would benefit from having the hernia fixed. I have discussed with him in detail the risks and benefits of the operation to fix the hernia as well as some of the technical aspects including the use of mesh and the risk of loss of the testicle and he understands and wishes to proceed. He is currently having some urinary retention issues and would like to schedule the surgery after the first of the year which would give his urologist time to sort out his urinary problem.He understands the signs and symptoms of bowel obstruction and strangulation and agrees to call if he has any problems. Otherwise we will plan for a left inguinal hernia repair with mesh     Signed by Luella Cook, MD (12/18/2013 11:27 AM)

## 2014-03-07 NOTE — Anesthesia Postprocedure Evaluation (Signed)
  Anesthesia Post-op Note  Patient: Samuel Torres  Procedure(s) Performed: Procedure(s): LEFT INCARCERATED INGUINAL HERNIA REPAIR WITH MESH  (Left) INSERTION OF MESH (Left)  Patient Location: PACU  Anesthesia Type:General and GA combined with regional for post-op pain  Level of Consciousness: awake, alert  and oriented  Airway and Oxygen Therapy: Patient Spontanous Breathing and Patient connected to nasal cannula oxygen  Post-op Pain: mild  Post-op Assessment: Post-op Vital signs reviewed, Patient's Cardiovascular Status Stable, Respiratory Function Stable, RESPIRATORY FUNCTION UNSTABLE, Patent Airway and Pain level controlled  Post-op Vital Signs: stable  Last Vitals:  Filed Vitals:   03/07/14 1413  BP: 112/65  Pulse: 77  Temp:   Resp: 15    Complications: No apparent anesthesia complications

## 2014-03-07 NOTE — Transfer of Care (Signed)
Immediate Anesthesia Transfer of Care Note  Patient: Samuel Torres  Procedure(s) Performed: Procedure(s): LEFT INCARCERATED INGUINAL HERNIA REPAIR WITH MESH  (Left) INSERTION OF MESH (Left)  Patient Location: PACU  Anesthesia Type:General  Level of Consciousness: awake, oriented and patient cooperative  Airway & Oxygen Therapy: Patient Spontanous Breathing and Patient connected to nasal cannula oxygen  Post-op Assessment: Report given to PACU RN and Post -op Vital signs reviewed and stable  Post vital signs: Reviewed  Complications: No apparent anesthesia complications

## 2014-03-07 NOTE — Anesthesia Preprocedure Evaluation (Addendum)
Anesthesia Evaluation  Patient identified by MRN, date of birth, ID band Patient awake    Reviewed: Allergy & Precautions, NPO status , Patient's Chart, lab work & pertinent test results, reviewed documented beta blocker date and time   History of Anesthesia Complications Negative for: history of anesthetic complications  Airway Mallampati: II  TM Distance: >3 FB Neck ROM: Full    Dental  (+) Teeth Intact, Dental Advisory Given   Pulmonary neg pulmonary ROS,  breath sounds clear to auscultation        Cardiovascular hypertension, Pt. on medications and Pt. on home beta blockers Rhythm:Regular Rate:Normal     Neuro/Psych negative neurological ROS  negative psych ROS   GI/Hepatic Neg liver ROS, GERD-  Medicated and Controlled,  Endo/Other    Renal/GU negative Renal ROS     Musculoskeletal negative musculoskeletal ROS (+)   Abdominal   Peds  Hematology   Anesthesia Other Findings   Reproductive/Obstetrics negative OB ROS                          Anesthesia Physical Anesthesia Plan  ASA: II  Anesthesia Plan: General   Post-op Pain Management:    Induction: Intravenous  Airway Management Planned: LMA  Additional Equipment:   Intra-op Plan:   Post-operative Plan:   Informed Consent: I have reviewed the patients History and Physical, chart, labs and discussed the procedure including the risks, benefits and alternatives for the proposed anesthesia with the patient or authorized representative who has indicated his/her understanding and acceptance.   Dental advisory given  Plan Discussed with: CRNA and Anesthesiologist  Anesthesia Plan Comments: (L. inguinal hernia Hypertension Renal insufficiency Cr 1.62 Gout  Plan GA with TAP block and LMA  Roberts Gaudy, MD)        Anesthesia Quick Evaluation

## 2014-03-07 NOTE — Discharge Instructions (Signed)
What to eat: ° °For your first meals, you should eat lightly; only small meals initially.  If you do not have nausea, you may eat larger meals.  Avoid spicy, greasy and heavy food.   ° °General Anesthesia, Adult, Care After  °Refer to this sheet in the next few weeks. These instructions provide you with information on caring for yourself after your procedure. Your health care provider may also give you more specific instructions. Your treatment has been planned according to current medical practices, but problems sometimes occur. Call your health care provider if you have any problems or questions after your procedure.  °WHAT TO EXPECT AFTER THE PROCEDURE  °After the procedure, it is typical to experience:  °Sleepiness.  °Nausea and vomiting. °HOME CARE INSTRUCTIONS  °For the first 24 hours after general anesthesia:  °Have a responsible person with you.  °Do not drive a car. If you are alone, do not take public transportation.  °Do not drink alcohol.  °Do not take medicine that has not been prescribed by your health care provider.  °Do not sign important papers or make important decisions.  °You may resume a normal diet and activities as directed by your health care provider.  °Change bandages (dressings) as directed.  °If you have questions or problems that seem related to general anesthesia, call the hospital and ask for the anesthetist or anesthesiologist on call. °SEEK MEDICAL CARE IF:  °You have nausea and vomiting that continue the day after anesthesia.  °You develop a rash. °SEEK IMMEDIATE MEDICAL CARE IF:  °You have difficulty breathing.  °You have chest pain.  °You have any allergic problems. °Document Released: 05/09/2000 Document Revised: 10/03/2012 Document Reviewed: 08/16/2012  °ExitCare® Patient Information ©2014 [employer].  ° ° °Laceration Care, Adult  ° ° °A laceration is a cut that goes through all layers of the skin. The cut goes into the tissue beneath the skin.  °HOME CARE  °For stitches  (sutures) or staples:  °Keep the cut clean and dry.  °If you have a bandage (dressing), change it at least once a day. Change the bandage if it gets wet or dirty, or as told by your doctor.  °Wash the cut with soap and water 2 times a day. Rinse the cut with water. Pat it dry with a clean towel.  °Put a thin layer of medicated cream on the cut as told by your doctor.  °You may shower after the first 24 hours. Do not soak the cut in water until the stitches are removed.  °Only take medicines as told by your doctor.  °Have your stitches or staples removed as told by your doctor. °For skin adhesive strips:  °Keep the cut clean and dry.  °Do not get the strips wet. You may take a bath, but be careful to keep the cut dry.  °If the cut gets wet, pat it dry with a clean towel.  °The strips will fall off on their own. Do not remove the strips that are still stuck to the cut. °For wound glue:  °You may shower or take baths. Do not soak or scrub the cut. Do not swim. Avoid heavy sweating until the glue falls off on its own. After a shower or bath, pat the cut dry with a clean towel.  °Do not put medicine on your cut until the glue falls off.  °If you have a bandage, do not put tape over the glue.  °Avoid lots of sunlight or tanning lamps until   the glue falls off. Put sunscreen on the cut for the first year to reduce your scar.  °The glue will fall off on its own. Do not pick at the glue. °You may need a tetanus shot if:  °You cannot remember when you had your last tetanus shot.  °You have never had a tetanus shot. °If you need a tetanus shot and you choose not to have one, you may get tetanus. Sickness from tetanus can be serious.  °GET HELP RIGHT AWAY IF:  °Your pain does not get better with medicine.  °Your arm, hand, leg, or foot loses feeling (numbness) or changes color.  °Your cut is bleeding.  °Your joint feels weak, or you cannot use your joint.  °You have painful lumps on your body.  °Your cut is red, puffy (swollen),  or painful.  °You have a red line on the skin near the cut.  °You have yellowish-white fluid (pus) coming from the cut.  °You have a fever.  °You have a bad smell coming from the cut or bandage.  °Your cut breaks open before or after stitches are removed.  °You notice something coming out of the cut, such as wood or glass.  °You cannot move a finger or toe. °MAKE SURE YOU:  °Understand these instructions.  °Will watch your condition.  °Will get help right away if you are not doing well or get worse. °Document Released: 07/20/2007 Document Revised: 04/25/2011 Document Reviewed: 07/27/2010  °ExitCare® Patient Information ©2014 [employer].  ° ° °

## 2014-03-07 NOTE — Progress Notes (Signed)
Bedside report received from [facility] CRNA for primary care.

## 2014-03-07 NOTE — Anesthesia Procedure Notes (Addendum)
Procedure Name: LMA Insertion Date/Time: 03/07/2014 6:46 AM Performed by: Jenne Campus Pre-anesthesia Checklist: Patient identified, Emergency Drugs available, Suction available, Patient being monitored and Timeout performed Patient Re-evaluated:Patient Re-evaluated prior to inductionOxygen Delivery Method: Circle system utilized Preoxygenation: Pre-oxygenation with 100% oxygen Intubation Type: IV induction LMA: LMA inserted LMA Size: 5.0 Number of attempts: 1 Placement Confirmation: positive ETCO2,  CO2 detector and breath sounds checked- equal and bilateral Tube secured with: Tape Dental Injury: Teeth and Oropharynx as per pre-operative assessment     Procedure Name: Intubation Date/Time: 03/07/2014 9:43 AM Performed by: Luciana Axe K Pre-anesthesia Checklist: Patient identified, Emergency Drugs available, Suction available, Patient being monitored and Timeout performed Patient Re-evaluated:Patient Re-evaluated prior to inductionOxygen Delivery Method: Circle system utilized Preoxygenation: Pre-oxygenation with 100% oxygen Intubation Type: IV induction Ventilation: Mask ventilation without difficulty Laryngoscope Size: Miller and 2 Grade View: Grade I Tube type: Oral Tube size: 7.5 mm Number of attempts: 1 Airway Equipment and Method: Stylet Placement Confirmation: ETT inserted through vocal cords under direct vision,  positive ETCO2,  CO2 detector and breath sounds checked- equal and bilateral Secured at: 21 cm Tube secured with: Tape Dental Injury: Teeth and Oropharynx as per pre-operative assessment  Comments:  Surgeon concerned with size of hernia and needing muscle relaxation for exposure. Dr. Linna Caprice called to room exchange LMA for oral ETT.    Anesthesia Regional Block:  TAP block  Pre-Anesthetic Checklist: ,, timeout performed, Correct Patient, Correct Site, Correct Laterality, Correct Procedure, Correct Position, site marked, Risks and benefits discussed,   Surgical consent,  Pre-op evaluation,  At surgeon's request and post-op pain management  Laterality: Left  Prep: chloraprep       Needles:  Injection technique: Single-shot  Needle Type: Echogenic Stimulator Needle          Additional Needles:  Procedures: ultrasound guided (picture in chart) TAP block Narrative:  Start time: 03/07/2014 9:40 AM End time: 03/07/2014 9:45 AM Injection made incrementally with aspirations every 5 mL.  Performed by: Personally   Additional Notes: 30 cc 0.5% Marcaine 1:200 epi injected easily

## 2014-03-10 ENCOUNTER — Encounter (HOSPITAL_COMMUNITY): Payer: Self-pay | Admitting: General Surgery

## 2014-03-21 ENCOUNTER — Other Ambulatory Visit (INDEPENDENT_AMBULATORY_CARE_PROVIDER_SITE_OTHER): Payer: Self-pay

## 2014-03-21 DIAGNOSIS — N5089 Other specified disorders of the male genital organs: Secondary | ICD-10-CM

## 2014-03-21 DIAGNOSIS — K4090 Unilateral inguinal hernia, without obstruction or gangrene, not specified as recurrent: Secondary | ICD-10-CM

## 2014-03-25 ENCOUNTER — Ambulatory Visit
Admission: RE | Admit: 2014-03-25 | Discharge: 2014-03-25 | Disposition: A | Discharge status: Home / Self Care | Payer: Medicare HMO | Source: Ambulatory Visit | Attending: General Surgery | Admitting: General Surgery

## 2014-12-04 DIAGNOSIS — Z0000 Encounter for general adult medical examination without abnormal findings: Secondary | ICD-10-CM | POA: Diagnosis not present

## 2014-12-04 DIAGNOSIS — M109 Gout, unspecified: Secondary | ICD-10-CM | POA: Diagnosis not present

## 2014-12-04 DIAGNOSIS — K219 Gastro-esophageal reflux disease without esophagitis: Secondary | ICD-10-CM | POA: Diagnosis not present

## 2014-12-04 DIAGNOSIS — I10 Essential (primary) hypertension: Secondary | ICD-10-CM | POA: Diagnosis not present

## 2014-12-04 DIAGNOSIS — Z85038 Personal history of other malignant neoplasm of large intestine: Secondary | ICD-10-CM | POA: Diagnosis not present

## 2014-12-04 DIAGNOSIS — Z125 Encounter for screening for malignant neoplasm of prostate: Secondary | ICD-10-CM | POA: Diagnosis not present

## 2014-12-11 DIAGNOSIS — I10 Essential (primary) hypertension: Secondary | ICD-10-CM | POA: Diagnosis not present

## 2014-12-11 DIAGNOSIS — N183 Chronic kidney disease, stage 3 (moderate): Secondary | ICD-10-CM | POA: Diagnosis not present

## 2014-12-11 DIAGNOSIS — M109 Gout, unspecified: Secondary | ICD-10-CM | POA: Diagnosis not present

## 2014-12-11 DIAGNOSIS — Z1212 Encounter for screening for malignant neoplasm of rectum: Secondary | ICD-10-CM | POA: Diagnosis not present

## 2014-12-11 DIAGNOSIS — Z23 Encounter for immunization: Secondary | ICD-10-CM | POA: Diagnosis not present

## 2014-12-11 DIAGNOSIS — K219 Gastro-esophageal reflux disease without esophagitis: Secondary | ICD-10-CM | POA: Diagnosis not present

## 2014-12-17 DIAGNOSIS — X32XXXA Exposure to sunlight, initial encounter: Secondary | ICD-10-CM | POA: Diagnosis not present

## 2014-12-17 DIAGNOSIS — L570 Actinic keratosis: Secondary | ICD-10-CM | POA: Diagnosis not present

## 2014-12-17 DIAGNOSIS — D225 Melanocytic nevi of trunk: Secondary | ICD-10-CM | POA: Diagnosis not present

## 2015-01-21 DIAGNOSIS — Z1212 Encounter for screening for malignant neoplasm of rectum: Secondary | ICD-10-CM | POA: Diagnosis not present

## 2015-01-21 DIAGNOSIS — Z1211 Encounter for screening for malignant neoplasm of colon: Secondary | ICD-10-CM | POA: Diagnosis not present

## 2015-03-02 DIAGNOSIS — K219 Gastro-esophageal reflux disease without esophagitis: Secondary | ICD-10-CM | POA: Diagnosis not present

## 2015-03-02 DIAGNOSIS — R490 Dysphonia: Secondary | ICD-10-CM | POA: Diagnosis not present

## 2015-08-26 DIAGNOSIS — H25013 Cortical age-related cataract, bilateral: Secondary | ICD-10-CM | POA: Diagnosis not present

## 2015-09-08 DIAGNOSIS — Z0100 Encounter for examination of eyes and vision without abnormal findings: Secondary | ICD-10-CM | POA: Diagnosis not present

## 2015-09-10 DIAGNOSIS — S52531A Colles' fracture of right radius, initial encounter for closed fracture: Secondary | ICD-10-CM | POA: Diagnosis not present

## 2015-09-18 DIAGNOSIS — S52531D Colles' fracture of right radius, subsequent encounter for closed fracture with routine healing: Secondary | ICD-10-CM | POA: Diagnosis not present

## 2015-10-02 DIAGNOSIS — S52531D Colles' fracture of right radius, subsequent encounter for closed fracture with routine healing: Secondary | ICD-10-CM | POA: Diagnosis not present

## 2015-10-23 DIAGNOSIS — S52531D Colles' fracture of right radius, subsequent encounter for closed fracture with routine healing: Secondary | ICD-10-CM | POA: Diagnosis not present

## 2015-11-18 DIAGNOSIS — Z23 Encounter for immunization: Secondary | ICD-10-CM | POA: Diagnosis not present

## 2015-11-19 ENCOUNTER — Ambulatory Visit (INDEPENDENT_AMBULATORY_CARE_PROVIDER_SITE_OTHER): Payer: Medicare HMO | Admitting: Sports Medicine

## 2015-11-19 DIAGNOSIS — S52531D Colles' fracture of right radius, subsequent encounter for closed fracture with routine healing: Secondary | ICD-10-CM | POA: Diagnosis not present

## 2015-12-16 DIAGNOSIS — K219 Gastro-esophageal reflux disease without esophagitis: Secondary | ICD-10-CM | POA: Diagnosis not present

## 2015-12-16 DIAGNOSIS — Z0000 Encounter for general adult medical examination without abnormal findings: Secondary | ICD-10-CM | POA: Diagnosis not present

## 2015-12-16 DIAGNOSIS — Z125 Encounter for screening for malignant neoplasm of prostate: Secondary | ICD-10-CM | POA: Diagnosis not present

## 2015-12-16 DIAGNOSIS — I10 Essential (primary) hypertension: Secondary | ICD-10-CM | POA: Diagnosis not present

## 2015-12-21 DIAGNOSIS — I10 Essential (primary) hypertension: Secondary | ICD-10-CM | POA: Diagnosis not present

## 2015-12-21 DIAGNOSIS — M109 Gout, unspecified: Secondary | ICD-10-CM | POA: Diagnosis not present

## 2015-12-21 DIAGNOSIS — K219 Gastro-esophageal reflux disease without esophagitis: Secondary | ICD-10-CM | POA: Diagnosis not present

## 2015-12-21 DIAGNOSIS — Z0001 Encounter for general adult medical examination with abnormal findings: Secondary | ICD-10-CM | POA: Diagnosis not present

## 2015-12-21 DIAGNOSIS — Z1212 Encounter for screening for malignant neoplasm of rectum: Secondary | ICD-10-CM | POA: Diagnosis not present

## 2016-03-02 ENCOUNTER — Emergency Department (HOSPITAL_COMMUNITY)
Admission: EM | Admit: 2016-03-02 | Discharge: 2016-03-02 | Disposition: A | Discharge status: Home / Self Care | Payer: Medicare HMO | Attending: Emergency Medicine | Admitting: Emergency Medicine

## 2016-03-02 ENCOUNTER — Emergency Department (HOSPITAL_COMMUNITY): Payer: Medicare HMO

## 2016-03-02 ENCOUNTER — Encounter (HOSPITAL_COMMUNITY): Payer: Self-pay | Admitting: *Deleted

## 2016-03-02 DIAGNOSIS — W010XXA Fall on same level from slipping, tripping and stumbling without subsequent striking against object, initial encounter: Secondary | ICD-10-CM | POA: Diagnosis not present

## 2016-03-02 DIAGNOSIS — Z7982 Long term (current) use of aspirin: Secondary | ICD-10-CM | POA: Insufficient documentation

## 2016-03-02 DIAGNOSIS — S52612A Displaced fracture of left ulna styloid process, initial encounter for closed fracture: Secondary | ICD-10-CM | POA: Diagnosis not present

## 2016-03-02 DIAGNOSIS — Y939 Activity, unspecified: Secondary | ICD-10-CM | POA: Insufficient documentation

## 2016-03-02 DIAGNOSIS — Y929 Unspecified place or not applicable: Secondary | ICD-10-CM | POA: Insufficient documentation

## 2016-03-02 DIAGNOSIS — Z79899 Other long term (current) drug therapy: Secondary | ICD-10-CM | POA: Insufficient documentation

## 2016-03-02 DIAGNOSIS — Y999 Unspecified external cause status: Secondary | ICD-10-CM | POA: Diagnosis not present

## 2016-03-02 DIAGNOSIS — S52502A Unspecified fracture of the lower end of left radius, initial encounter for closed fracture: Secondary | ICD-10-CM | POA: Diagnosis not present

## 2016-03-02 DIAGNOSIS — I10 Essential (primary) hypertension: Secondary | ICD-10-CM | POA: Diagnosis not present

## 2016-03-02 DIAGNOSIS — S62102A Fracture of unspecified carpal bone, left wrist, initial encounter for closed fracture: Secondary | ICD-10-CM

## 2016-03-02 DIAGNOSIS — S6992XA Unspecified injury of left wrist, hand and finger(s), initial encounter: Secondary | ICD-10-CM | POA: Diagnosis present

## 2016-03-02 NOTE — ED Triage Notes (Signed)
The pt is c/o lt wrist pain .  He fell outside on the concrete

## 2016-03-02 NOTE — Progress Notes (Signed)
Orthopedic Tech Progress Note Patient Details:  Samuel Torres September 04, 1933 [id number]  Ortho Devices Type of Ortho Device: Ace wrap, Arm sling, Sugartong splint Ortho Device/Splint Location: lue Ortho Device/Splint Interventions: Application   Kyelle Urbas 03/02/2016, 7:21 PM

## 2016-03-02 NOTE — Discharge Instructions (Signed)
Please be sure to call your orthopedic office for an appointment next week.  Return here for concerning changes in your condition.

## 2016-03-02 NOTE — ED Provider Notes (Signed)
[name] Provider Note   CSN: [id number] Arrival date & time: 03/02/16  1711   By signing my name below, I, Samuel Torres, attest that this documentation has been prepared under the direction and in the presence of Carmin Muskrat, MD . Electronically Signed: Evelene Torres, Scribe. 03/02/2016. 6:05 PM. History   Chief Complaint Chief Complaint  Patient presents with  . Wrist Pain    The history is provided by the patient. No language interpreter was used.     HPI Comments:  Samuel Torres is a 83 y.o. male who presents to the Emergency Department complaining of sudden onset, moderate left wrist pain s/p fall today. Pt slipped in the snow and landed on the concrete; no LOC or head injury. He has been able to ambulate since the fall without difficulty. Pt has no other complaints or symptoms at this time.   Past Medical History:  Diagnosis Date  . Gout   . Hypertension     There are no active problems to display for this patient.   Past Surgical History:  Procedure Laterality Date  . COLON SURGERY    . INGUINAL HERNIA REPAIR Left 03/07/2014   Procedure: LEFT INCARCERATED INGUINAL HERNIA REPAIR WITH MESH ;  Surgeon: Autumn Messing III, MD;  Location: [facility];  Service: General;  Laterality: Left;  . INSERTION OF MESH Left 03/07/2014   Procedure: INSERTION OF MESH;  Surgeon: Autumn Messing III, MD;  Location: [facility];  Service: General;  Laterality: Left;  . KNEE SURGERY Bilateral        Home Medications    Prior to Admission medications   Medication Sig Start Date End Date Taking? Authorizing Provider  acetaminophen (TYLENOL) 325 MG tablet Take 650 mg by mouth every 6 (six) hours as needed.    Historical Provider, MD  allopurinol (ZYLOPRIM) 300 MG tablet Take 300 mg by mouth daily. 09/26/13   Historical Provider, MD  amLODipine (NORVASC) 10 MG tablet Take 10 mg by mouth daily.    Historical Provider, MD  aspirin EC 81 MG tablet Take 81 mg by mouth daily.    Historical  Provider, MD  atenolol (TENORMIN) 50 MG tablet Take 50 mg by mouth daily. 10/21/13   Historical Provider, MD  benazepril-hydrochlorthiazide (LOTENSIN HCT) 20-12.5 MG per tablet Take 1 tablet by mouth daily. 10/21/13   Historical Provider, MD  cephALEXin (KEFLEX) 500 MG capsule Take 500 mg by mouth 4 (four) times daily. Only before having a procedure    Historical Provider, MD  ferrous sulfate 325 (65 FE) MG tablet Take 325 mg by mouth daily with breakfast.    Historical Provider, MD  Multiple Vitamins-Minerals (ONE-A-DAY MENS HEALTH FORMULA PO) Take 1 tablet by mouth daily.    Historical Provider, MD  oxyCODONE-acetaminophen (ROXICET) 5-325 MG per tablet Take 1-2 tablets by mouth every 4 (four) hours as needed. 03/07/14   Autumn Messing III, MD  pantoprazole (PROTONIX) 40 MG tablet Take 40 mg by mouth daily. 09/26/13   Historical Provider, MD  tamsulosin (FLOMAX) 0.4 MG CAPS capsule Take 1 capsule (0.4 mg total) by mouth daily. 11/24/13   [name], MD    Family History No family history on file.  Social History Social History  Substance Use Topics  . Smoking status: Never Smoker  . Smokeless tobacco: Never Used  . Alcohol use No     Allergies   Patient has no known allergies.   Review of Systems Review of Systems  Constitutional:  Per HPI, otherwise negative  HENT:       Per HPI, otherwise negative  Respiratory:       Per HPI, otherwise negative  Cardiovascular:       Per HPI, otherwise negative  Gastrointestinal: Negative for vomiting.  Endocrine:       Negative aside from HPI  Genitourinary:       Neg aside from HPI   Musculoskeletal:       Per HPI, otherwise negative  Skin: Negative.   Neurological: Negative for syncope.    Physical Exam Updated Vital Signs BP 120/85 (BP Location: Right Arm)   Pulse 98   Temp 98.2 F (36.8 C) (Oral)   Resp 19   Ht 5\' 9"  (1.753 m)   Wt 189 lb (85.7 kg)   SpO2 96%   BMI 27.91 kg/m   Physical Exam  Constitutional:  He is oriented to person, place, and time. He appears well-developed. No distress.  HENT:  Head: Normocephalic and atraumatic.  Eyes: Conjunctivae and EOM are normal.  Cardiovascular: Normal rate, regular rhythm and intact distal pulses.   Pulmonary/Chest: Effort normal. No stridor. No respiratory distress.  Abdominal: He exhibits no distension.  Musculoskeletal: He exhibits edema.  Edema noted to the dorsum of left hand about the thenar eminence.minimal tenderness to palpation about the posterior lateral mid wrist Range of motion limited secondary to swelling, but the patient does not describe pain with range of motion exercises.  appropriate sensation throughout all nerves Pulses intact   Neurological: He is alert and oriented to person, place, and time.  Skin: Skin is warm and dry.  Psychiatric: He has a normal mood and affect.  Nursing note and vitals reviewed.    ED Treatments / Results  DIAGNOSTIC STUDIES:  Oxygen Saturation is 96% on RA, normal by my interpretation.    COORDINATION OF CARE:  6:07 PM Discussed treatment plan with pt at bedside and pt agreed to plan.  Radiology Dg Wrist Complete Left  Result Date: 03/02/2016 CLINICAL DATA:  Fall.  Wrist pain. EXAM: LEFT WRIST - COMPLETE 3+ VIEW COMPARISON:  10/29/2008 FINDINGS: Comminuted distal radius fracture with apex anterior angulation noted. Although fracture extension to the distal articular surface of the radius is suspected common is not well demonstrated on this study. Ulnar styloid fracture associated. Advanced degenerative changes noted in the first carpometacarpal joint and IP joints of the thumb. Bones are diffusely demineralized. IMPRESSION: Comminuted distal radius and ulnar styloid fractures. Electronically Signed   By: Misty Stanley M.D.   On: 03/02/2016 18:36    Procedures ORTHOPEDIC INJURY TREATMENT Date/Time: 03/02/2016 7:31 PM Performed by: Carmin Muskrat Authorized by: Carmin Muskrat  Consent:  Verbal consent obtained. Risks and benefits: risks, benefits and alternatives were discussed Consent given by: patient Patient understanding: patient states understanding of the procedure being performed Patient consent: the patient's understanding of the procedure matches consent given Procedure consent: procedure consent matches procedure scheduled Relevant documents: relevant documents present and verified Test results: test results available and properly labeled Site marked: the operative site was marked Imaging studies: imaging studies available Required items: required blood products, implants, devices, and special equipment available Patient identity confirmed: verbally with patient Injury location: wrist Location details: left wrist Injury type: fracture Fracture type: distal radius and ulnar styloid Pre-procedure neurovascular assessment: neurovascularly intact Pre-procedure distal perfusion: normal Pre-procedure neurological function: normal Pre-procedure range of motion: reduced  Anesthesia: Local anesthesia used: no  Sedation: Patient sedated: no Manipulation performed: no Supplies used: Ortho-Glass  Post-procedure neurovascular assessment: post-procedure neurovascularly intact Post-procedure distal perfusion: normal Post-procedure neurological function: normal Post-procedure range of motion: unchanged Patient tolerance: Patient tolerated the procedure well with no immediate complications Comments: Procedure performed with the assistance of our orthopedic technician. Subsequent, the patient was placed in a sling.     Initial Impression / Assessment and Plan / ED Course  I have reviewed the triage vital signs and the nursing notes.  Pertinent labs & imaging results that were available during my care of the patient were reviewed by me and considered in my medical decision making (see chart for details).  Clinical Course     This generally well-appearing male  presents after mechanical fall with isolated left wrist pain. Patient is found to have distal radius, ulnar styloid fracture. Patient is distally neurovascularly intact. Patient has minimal discomfort. Patient had appropriate, successful immobilization with the assistance of our orthopedic technician. Subsequently, the patient had preserved distal neurovascular status. Patient discharged in stable condition to follow-up with his orthopedist. Absent other complaints, report of head trauma, no other imaging indicated.   Final Clinical Impressions(s) / ED Diagnoses   Final diagnoses:  Closed fracture of left wrist, initial encounter    I personally performed the services described in this documentation, which was scribed in my presence. The recorded information has been reviewed and is accurate.       Carmin Muskrat, MD 03/02/16 [phone]

## 2016-03-02 NOTE — ED Notes (Signed)
Ortho tech aware of splint needed

## 2016-03-08 ENCOUNTER — Ambulatory Visit (INDEPENDENT_AMBULATORY_CARE_PROVIDER_SITE_OTHER): Payer: Medicare HMO | Admitting: Orthopaedic Surgery

## 2016-03-08 ENCOUNTER — Encounter (INDEPENDENT_AMBULATORY_CARE_PROVIDER_SITE_OTHER): Payer: Self-pay | Admitting: Orthopaedic Surgery

## 2016-03-08 ENCOUNTER — Ambulatory Visit (INDEPENDENT_AMBULATORY_CARE_PROVIDER_SITE_OTHER): Payer: Medicare HMO

## 2016-03-08 DIAGNOSIS — S52532A Colles' fracture of left radius, initial encounter for closed fracture: Secondary | ICD-10-CM

## 2016-03-08 MED ORDER — HYDROCODONE-ACETAMINOPHEN 5-325 MG PO TABS: 1.0000 | ORAL_TABLET | Freq: Four times a day (QID) | ORAL | 0 refills | Status: AC | PRN

## 2016-03-08 NOTE — Progress Notes (Addendum)
Office Visit Note   Patient: Samuel Torres           Date of Birth: 1933/11/04           MRN: [id number] Visit Date: 03/08/2016              Requested by: No referring provider defined for this encounter. PCP: Default, Provider, MD   Assessment & Plan: Visit Diagnoses:  1. Closed Colles' fracture of left radius, initial encounter     Plan: X-ray show stable dorsally angulated Colles' fracture. I think this can be treated successfully nonoperatively. Short arm cast was applied. Norco filled. Follow-up in one week with repeat 2 view x-rays of the left wrist in the cast.  Follow-Up Instructions: Return in about 1 week (around 03/15/2016).   Orders:  Orders Placed This Encounter  Procedures  . XR Wrist 2 Views Left   Meds ordered this encounter  Medications  . HYDROcodone-acetaminophen (NORCO) 5-325 MG tablet    Sig: Take 1 tablet by mouth every 6 (six) hours as needed.    Dispense:  30 tablet    Refill:  0      Procedures: No procedures performed   Clinical Data: No additional findings.   Subjective: Chief Complaint  Patient presents with  . Left Wrist - Fracture    Comminuted distal radius fracture    Patient is 83 year old gentleman who sustained a left distal radius fracture from mechanical fall on 03/02/2016. He endorses moderate pain which improves with hydrocodone. He endorses bruising and swelling. Denies any numbness or tingling in the hand or fingers.    Review of Systems  Constitutional: Negative.   All other systems reviewed and are negative.    Objective: Vital Signs: There were no vitals taken for this visit.  Physical Exam  Constitutional: He is oriented to person, place, and time. He appears well-developed and well-nourished.  HENT:  Head: Normocephalic and atraumatic.  Eyes: Pupils are equal, round, and reactive to light.  Neck: Neck supple.  Pulmonary/Chest: Effort normal.  Abdominal: Soft.  Musculoskeletal: Normal range of  motion.  Neurological: He is alert and oriented to person, place, and time.  Skin: Skin is warm.  Psychiatric: He has a normal mood and affect. His behavior is normal. Judgment and thought content normal.  Nursing note and vitals reviewed.   Ortho Exam Exam of the left upper extremity shows mild bruising of the volar aspect of the wrist. The skin is intact. Strong radial pulse. No signs of carpal tunnel syndrome. Specialty Comments:  No specialty comments available.  Imaging: Xr Wrist 2 Views Left  Result Date: 03/08/2016 Stable distal radius fracture with dorsal angulation    PMFS History: Patient Active Problem List   Diagnosis Date Noted  . Fracture, Colles, left, closed 03/08/2016   Past Medical History:  Diagnosis Date  . Gout   . Hypertension     History reviewed. No pertinent family history.  Past Surgical History:  Procedure Laterality Date  . COLON SURGERY    . INGUINAL HERNIA REPAIR Left 03/07/2014   Procedure: LEFT INCARCERATED INGUINAL HERNIA REPAIR WITH MESH ;  Surgeon: Autumn Messing III, MD;  Location: [facility];  Service: General;  Laterality: Left;  . INSERTION OF MESH Left 03/07/2014   Procedure: INSERTION OF MESH;  Surgeon: Autumn Messing III, MD;  Location: [facility];  Service: General;  Laterality: Left;  . KNEE SURGERY Bilateral    Social History   Occupational History  . Not on  file.   Social History Main Topics  . Smoking status: Never Smoker  . Smokeless tobacco: Never Used  . Alcohol use No  . Drug use: No  . Sexual activity: Not on file

## 2016-03-15 ENCOUNTER — Ambulatory Visit (INDEPENDENT_AMBULATORY_CARE_PROVIDER_SITE_OTHER): Payer: Medicare HMO | Admitting: Orthopaedic Surgery

## 2016-03-15 ENCOUNTER — Ambulatory Visit (INDEPENDENT_AMBULATORY_CARE_PROVIDER_SITE_OTHER): Payer: Medicare HMO

## 2016-03-15 DIAGNOSIS — S52532A Colles' fracture of left radius, initial encounter for closed fracture: Secondary | ICD-10-CM

## 2016-03-15 DIAGNOSIS — M25532 Pain in left wrist: Secondary | ICD-10-CM | POA: Diagnosis not present

## 2016-03-15 NOTE — Progress Notes (Signed)
Patient follows up for distal radius fracture. He is doing better. The cast is irritating him on the volar side of the wrist. The short arm cast was reapplied. X-ray show stable distal radius fracture in acceptable alignment. No median nerve symptoms. See him back in 2 weeks with repeat 2 view x-rays of the left wrist in the cast.

## 2016-03-29 ENCOUNTER — Encounter (INDEPENDENT_AMBULATORY_CARE_PROVIDER_SITE_OTHER): Payer: Self-pay | Admitting: Orthopaedic Surgery

## 2016-03-29 ENCOUNTER — Ambulatory Visit (INDEPENDENT_AMBULATORY_CARE_PROVIDER_SITE_OTHER): Payer: Medicare HMO

## 2016-03-29 ENCOUNTER — Ambulatory Visit (INDEPENDENT_AMBULATORY_CARE_PROVIDER_SITE_OTHER): Payer: Medicare HMO | Admitting: Orthopaedic Surgery

## 2016-03-29 DIAGNOSIS — S52532D Colles' fracture of left radius, subsequent encounter for closed fracture with routine healing: Secondary | ICD-10-CM | POA: Diagnosis not present

## 2016-03-29 DIAGNOSIS — G5602 Carpal tunnel syndrome, left upper limb: Secondary | ICD-10-CM

## 2016-03-29 NOTE — Progress Notes (Signed)
4 week f/u visit for left distal radius fracture treated with [place].  Doing well.  No real complaints.  xrays show stable alignment in [facility].  Will d/c SAC and apply wrist brace.  Begin OT.  F/u 4 weeks with repeat xrays.

## 2016-04-26 ENCOUNTER — Ambulatory Visit (INDEPENDENT_AMBULATORY_CARE_PROVIDER_SITE_OTHER): Payer: Medicare HMO | Admitting: Orthopaedic Surgery

## 2016-04-29 ENCOUNTER — Ambulatory Visit (INDEPENDENT_AMBULATORY_CARE_PROVIDER_SITE_OTHER): Payer: Medicare HMO

## 2016-04-29 ENCOUNTER — Ambulatory Visit (INDEPENDENT_AMBULATORY_CARE_PROVIDER_SITE_OTHER): Payer: Medicare HMO | Admitting: Orthopaedic Surgery

## 2016-04-29 ENCOUNTER — Encounter (INDEPENDENT_AMBULATORY_CARE_PROVIDER_SITE_OTHER): Payer: Self-pay | Admitting: Orthopaedic Surgery

## 2016-04-29 DIAGNOSIS — S52532D Colles' fracture of left radius, subsequent encounter for closed fracture with routine healing: Secondary | ICD-10-CM

## 2016-04-29 NOTE — Progress Notes (Signed)
Status post left distal radius fracture we've been treating with a cast. He is doing well. He is improving. Range of motion is as expected. X-ray show healed distal radius fracture with dorsal angulation. Functioning speaking is doing well. He has good range of motion and strength. At this point he can increase his activity as tolerated. Follow-up with me as needed.

## 2016-08-31 DIAGNOSIS — R69 Illness, unspecified: Secondary | ICD-10-CM | POA: Diagnosis not present

## 2016-10-07 DIAGNOSIS — R69 Illness, unspecified: Secondary | ICD-10-CM | POA: Diagnosis not present

## 2016-12-26 DIAGNOSIS — I10 Essential (primary) hypertension: Secondary | ICD-10-CM | POA: Diagnosis not present

## 2016-12-26 DIAGNOSIS — K219 Gastro-esophageal reflux disease without esophagitis: Secondary | ICD-10-CM | POA: Diagnosis not present

## 2016-12-26 DIAGNOSIS — Z125 Encounter for screening for malignant neoplasm of prostate: Secondary | ICD-10-CM | POA: Diagnosis not present

## 2016-12-26 DIAGNOSIS — Z0000 Encounter for general adult medical examination without abnormal findings: Secondary | ICD-10-CM | POA: Diagnosis not present

## 2016-12-29 DIAGNOSIS — R7303 Prediabetes: Secondary | ICD-10-CM | POA: Diagnosis not present

## 2016-12-29 DIAGNOSIS — N183 Chronic kidney disease, stage 3 (moderate): Secondary | ICD-10-CM | POA: Diagnosis not present

## 2016-12-29 DIAGNOSIS — M1990 Unspecified osteoarthritis, unspecified site: Secondary | ICD-10-CM | POA: Diagnosis not present

## 2016-12-29 DIAGNOSIS — Z1212 Encounter for screening for malignant neoplasm of rectum: Secondary | ICD-10-CM | POA: Diagnosis not present

## 2016-12-29 DIAGNOSIS — E781 Pure hyperglyceridemia: Secondary | ICD-10-CM | POA: Diagnosis not present

## 2016-12-29 DIAGNOSIS — K219 Gastro-esophageal reflux disease without esophagitis: Secondary | ICD-10-CM | POA: Diagnosis not present

## 2016-12-29 DIAGNOSIS — I10 Essential (primary) hypertension: Secondary | ICD-10-CM | POA: Diagnosis not present

## 2016-12-29 DIAGNOSIS — Z0001 Encounter for general adult medical examination with abnormal findings: Secondary | ICD-10-CM | POA: Diagnosis not present

## 2016-12-29 DIAGNOSIS — M109 Gout, unspecified: Secondary | ICD-10-CM | POA: Diagnosis not present

## 2016-12-29 DIAGNOSIS — J310 Chronic rhinitis: Secondary | ICD-10-CM | POA: Diagnosis not present

## 2016-12-29 DIAGNOSIS — Z6830 Body mass index (BMI) 30.0-30.9, adult: Secondary | ICD-10-CM | POA: Diagnosis not present

## 2017-06-06 DIAGNOSIS — H66001 Acute suppurative otitis media without spontaneous rupture of ear drum, right ear: Secondary | ICD-10-CM | POA: Diagnosis not present

## 2017-06-13 DIAGNOSIS — J019 Acute sinusitis, unspecified: Secondary | ICD-10-CM | POA: Diagnosis not present

## 2017-06-20 DIAGNOSIS — H9209 Otalgia, unspecified ear: Secondary | ICD-10-CM | POA: Diagnosis not present

## 2017-06-20 DIAGNOSIS — H903 Sensorineural hearing loss, bilateral: Secondary | ICD-10-CM | POA: Diagnosis not present

## 2017-09-28 DIAGNOSIS — R69 Illness, unspecified: Secondary | ICD-10-CM | POA: Diagnosis not present

## 2017-10-18 DIAGNOSIS — H5203 Hypermetropia, bilateral: Secondary | ICD-10-CM | POA: Diagnosis not present

## 2017-10-19 DIAGNOSIS — Z0100 Encounter for examination of eyes and vision without abnormal findings: Secondary | ICD-10-CM | POA: Diagnosis not present

## 2017-10-27 DIAGNOSIS — Z23 Encounter for immunization: Secondary | ICD-10-CM | POA: Diagnosis not present

## 2017-12-11 DIAGNOSIS — J019 Acute sinusitis, unspecified: Secondary | ICD-10-CM | POA: Diagnosis not present

## 2017-12-11 DIAGNOSIS — R05 Cough: Secondary | ICD-10-CM | POA: Diagnosis not present

## 2017-12-13 DIAGNOSIS — J45909 Unspecified asthma, uncomplicated: Secondary | ICD-10-CM | POA: Diagnosis not present

## 2017-12-20 DIAGNOSIS — J4521 Mild intermittent asthma with (acute) exacerbation: Secondary | ICD-10-CM | POA: Diagnosis not present

## 2017-12-20 DIAGNOSIS — R05 Cough: Secondary | ICD-10-CM | POA: Diagnosis not present

## 2018-01-03 DIAGNOSIS — I10 Essential (primary) hypertension: Secondary | ICD-10-CM | POA: Diagnosis not present

## 2018-01-08 DIAGNOSIS — M1990 Unspecified osteoarthritis, unspecified site: Secondary | ICD-10-CM | POA: Diagnosis not present

## 2018-01-08 DIAGNOSIS — K219 Gastro-esophageal reflux disease without esophagitis: Secondary | ICD-10-CM | POA: Diagnosis not present

## 2018-01-08 DIAGNOSIS — Z7982 Long term (current) use of aspirin: Secondary | ICD-10-CM | POA: Diagnosis not present

## 2018-01-08 DIAGNOSIS — M109 Gout, unspecified: Secondary | ICD-10-CM | POA: Diagnosis not present

## 2018-01-08 DIAGNOSIS — I10 Essential (primary) hypertension: Secondary | ICD-10-CM | POA: Diagnosis not present

## 2018-01-08 DIAGNOSIS — E781 Pure hyperglyceridemia: Secondary | ICD-10-CM | POA: Diagnosis not present

## 2018-01-08 DIAGNOSIS — Z1212 Encounter for screening for malignant neoplasm of rectum: Secondary | ICD-10-CM | POA: Diagnosis not present

## 2018-01-08 DIAGNOSIS — N183 Chronic kidney disease, stage 3 (moderate): Secondary | ICD-10-CM | POA: Diagnosis not present

## 2018-01-08 DIAGNOSIS — N400 Enlarged prostate without lower urinary tract symptoms: Secondary | ICD-10-CM | POA: Diagnosis not present

## 2018-01-08 DIAGNOSIS — Z0000 Encounter for general adult medical examination without abnormal findings: Secondary | ICD-10-CM | POA: Diagnosis not present

## 2018-01-08 DIAGNOSIS — Z6830 Body mass index (BMI) 30.0-30.9, adult: Secondary | ICD-10-CM | POA: Diagnosis not present

## 2018-03-27 DIAGNOSIS — I10 Essential (primary) hypertension: Secondary | ICD-10-CM | POA: Diagnosis not present

## 2018-03-27 DIAGNOSIS — R05 Cough: Secondary | ICD-10-CM | POA: Diagnosis not present

## 2018-06-04 IMAGING — DX DG WRIST COMPLETE 3+V*L*
4 series · 4 of 4 positions shown · non-contrast
Comparison: 10/29/2008

CLINICAL DATA: Fall.  Wrist pain.

EXAM:
LEFT WRIST - COMPLETE 3+ VIEW

[wrist pa]
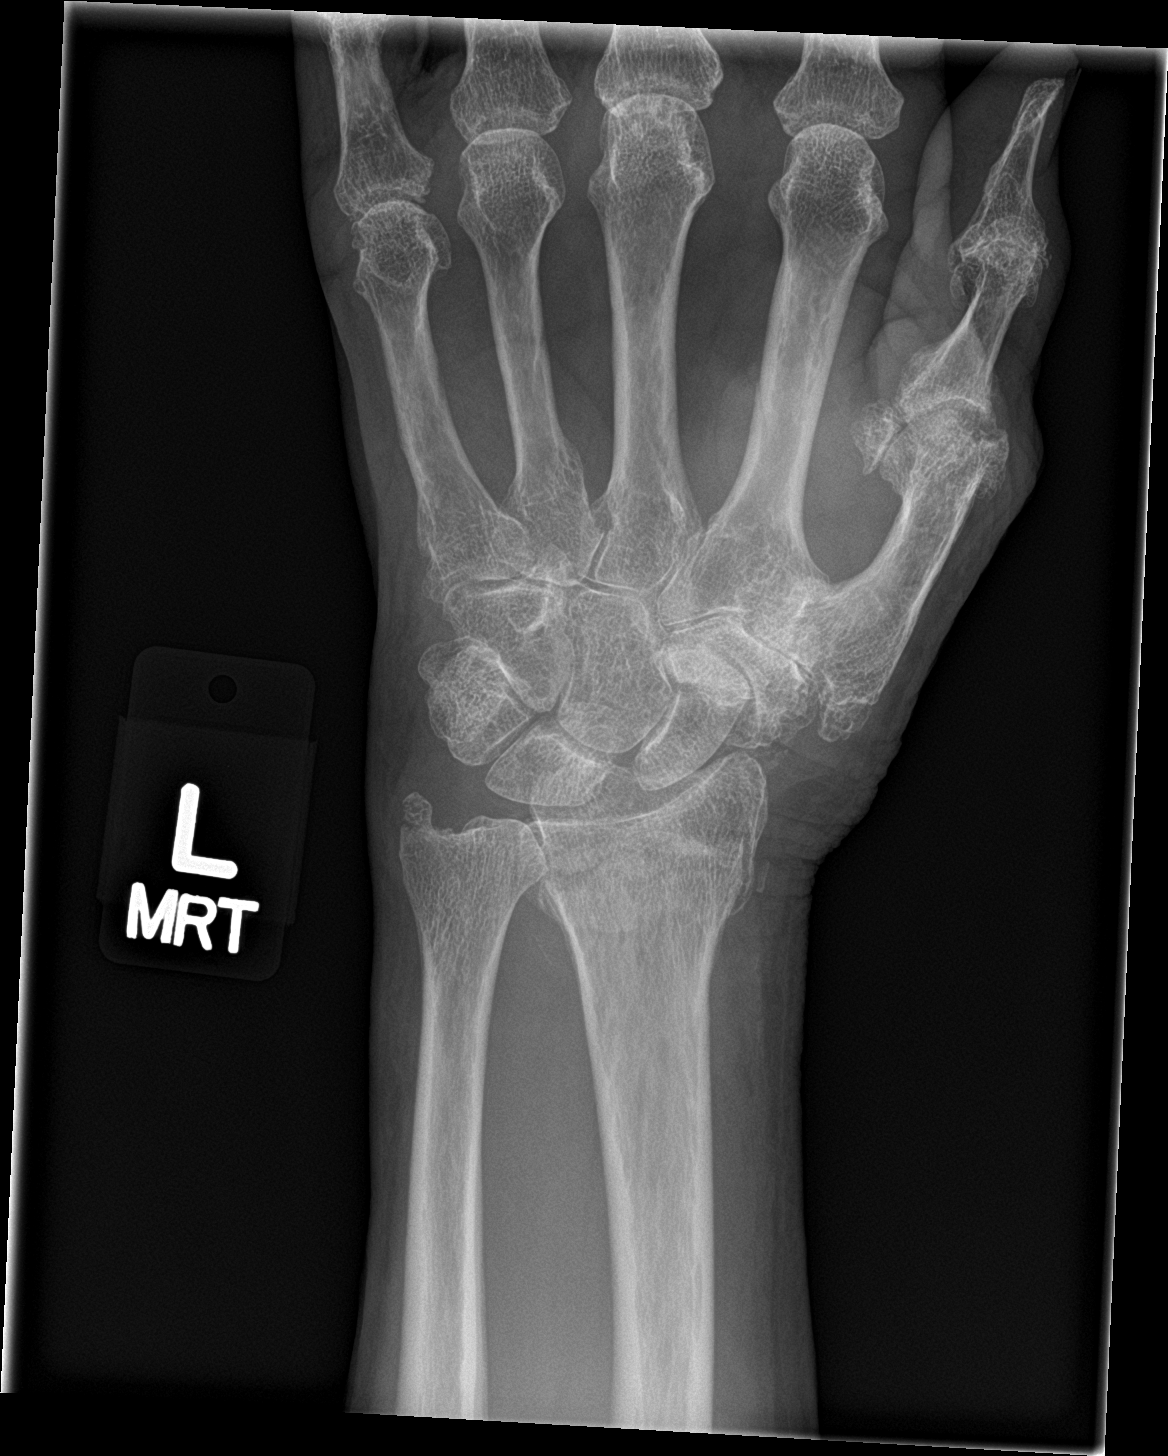

[wrist obl]
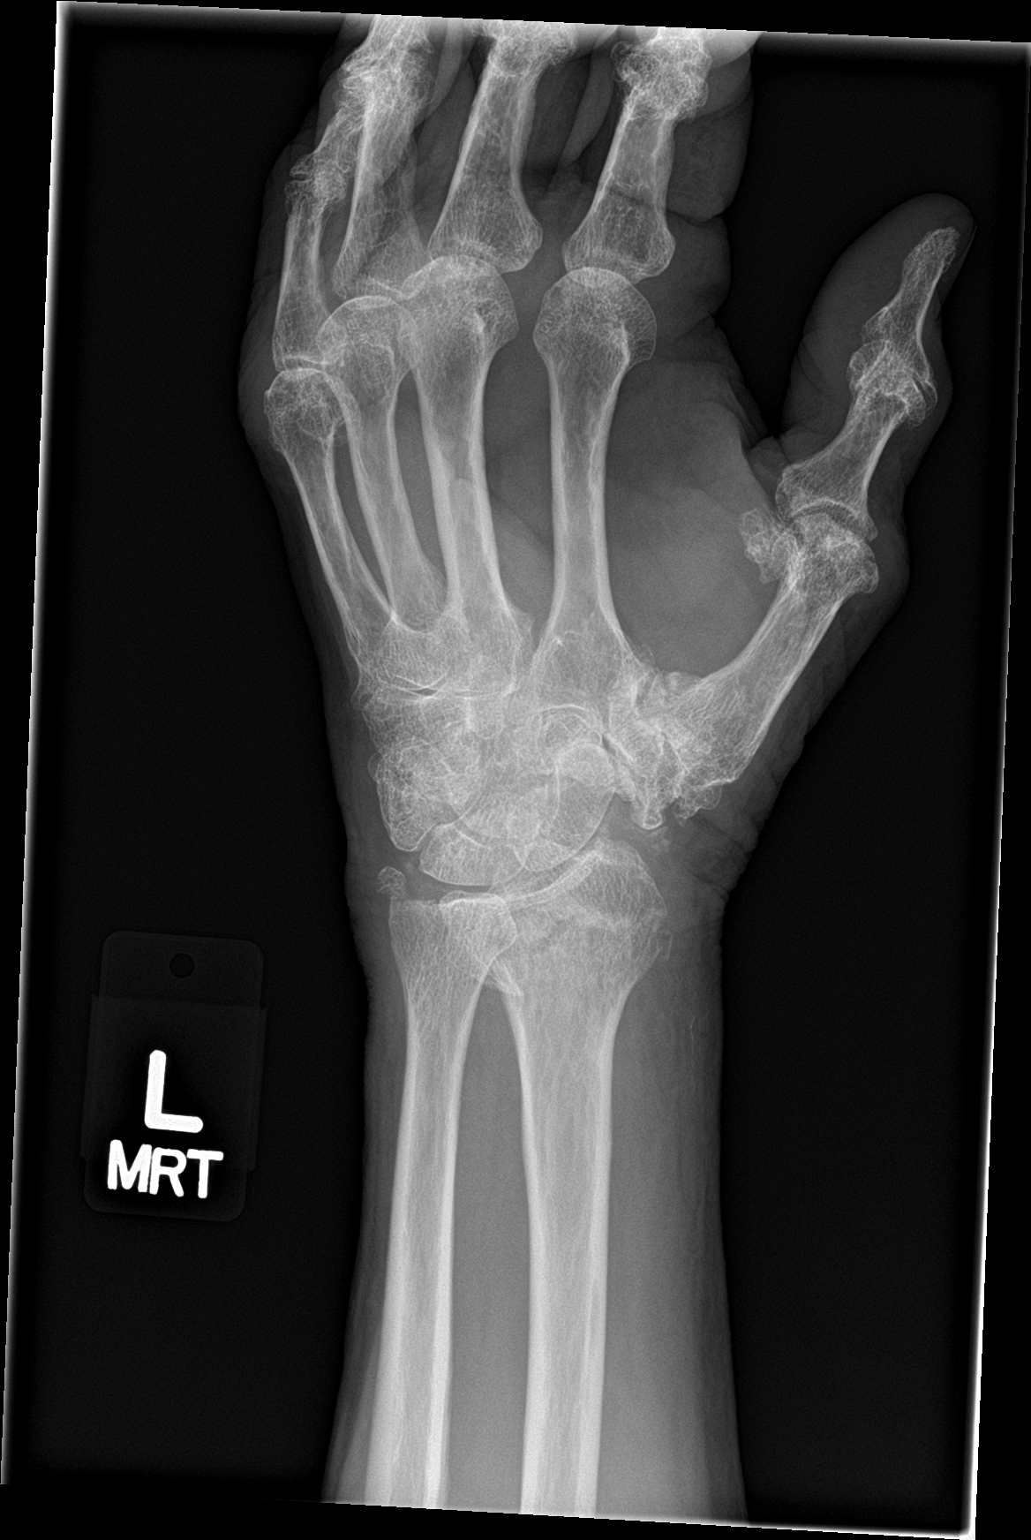

[wrist lat]
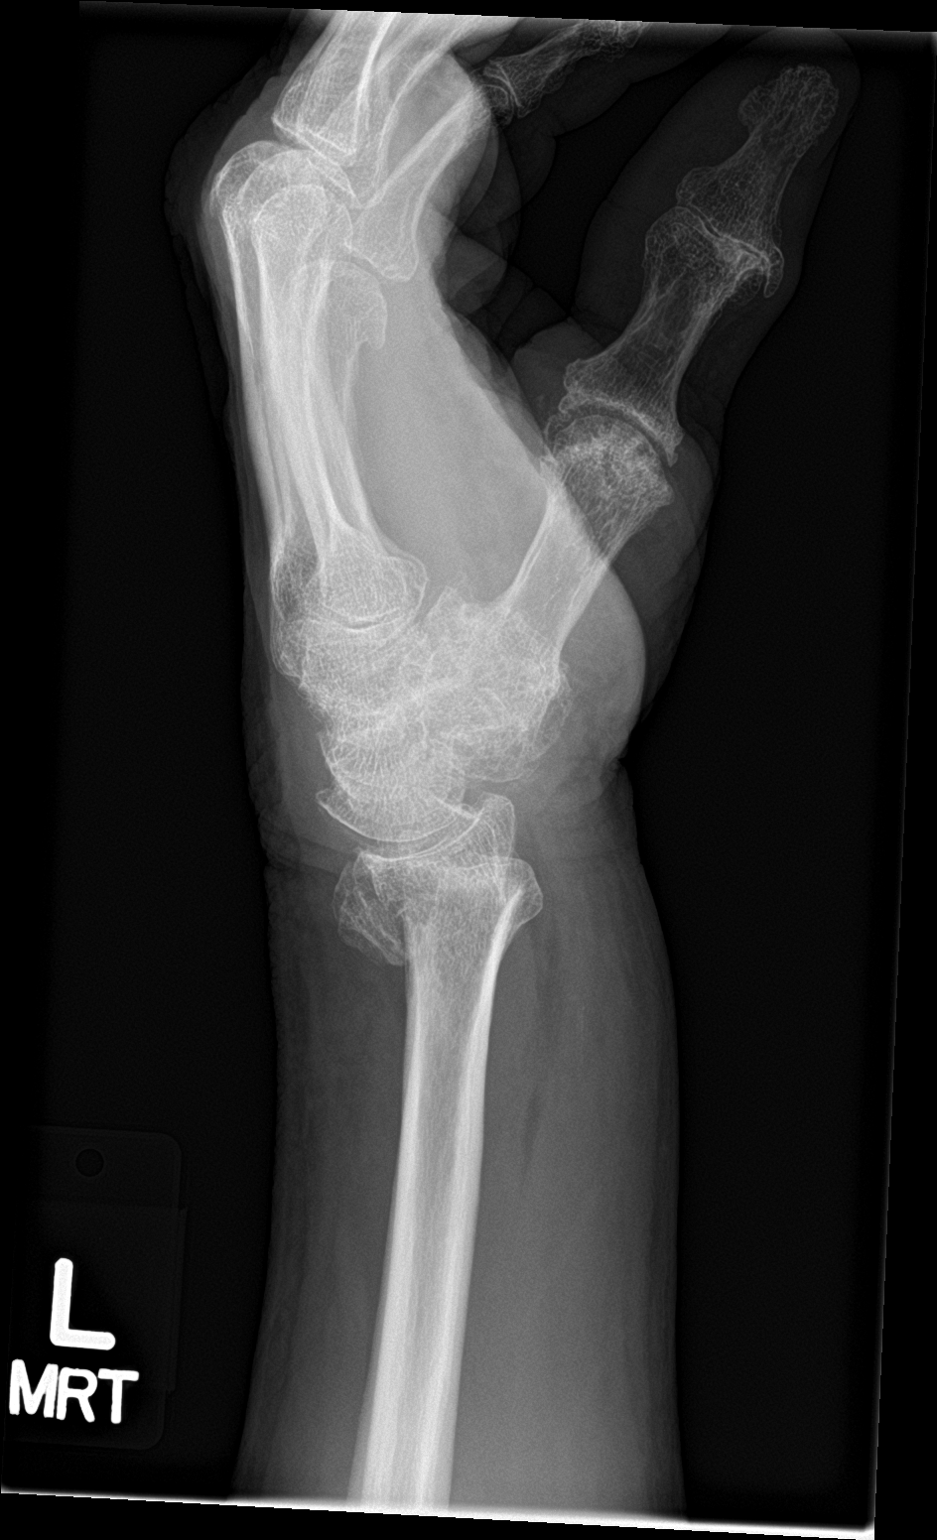

[wrist navicular]
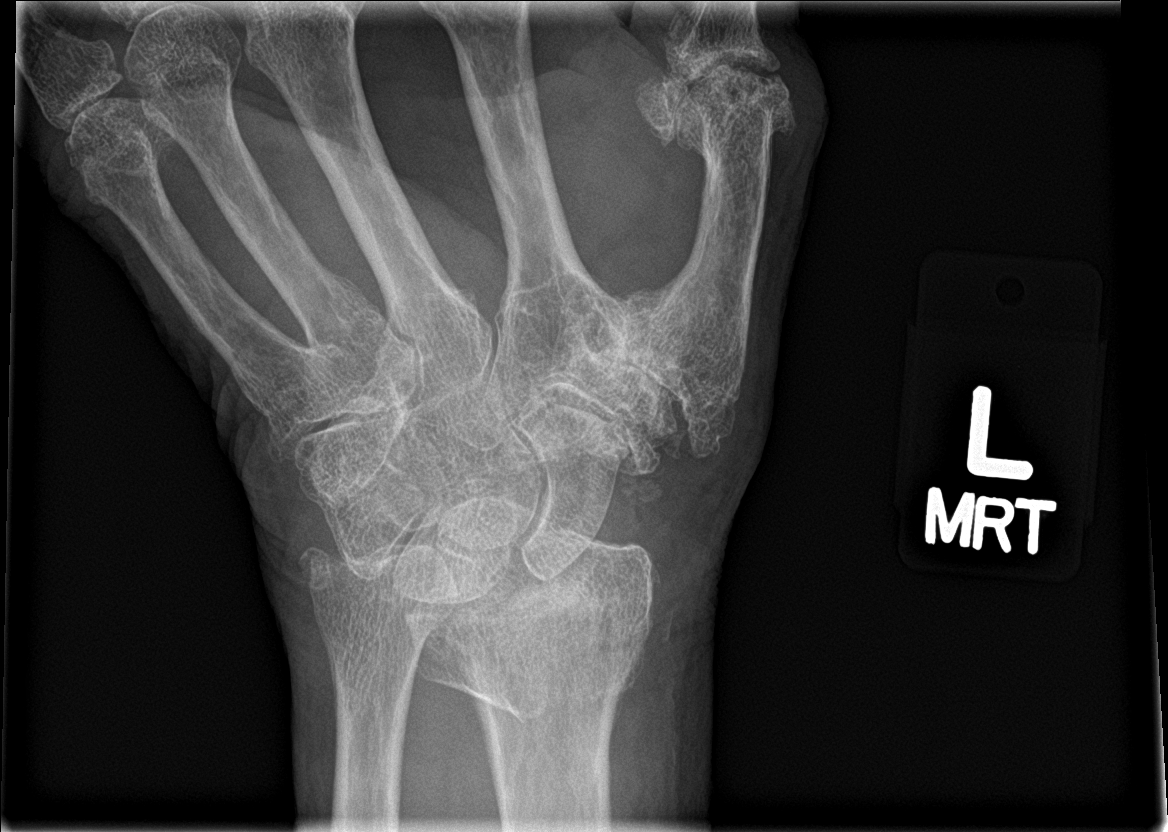

[4 of 4 positions shown; findings below may reference images not displayed]

FINDINGS: Comminuted distal radius fracture with apex anterior angulation
noted. Although fracture extension to the distal articular surface
of the radius is suspected common is not well demonstrated on this
study. Ulnar styloid fracture associated. Advanced degenerative
changes noted in the first carpometacarpal joint and IP joints of
the thumb. Bones are diffusely demineralized.
IMPRESSION: Comminuted distal radius and ulnar styloid fractures.

## 2018-08-23 DIAGNOSIS — H2513 Age-related nuclear cataract, bilateral: Secondary | ICD-10-CM | POA: Diagnosis not present

## 2018-08-23 DIAGNOSIS — H25013 Cortical age-related cataract, bilateral: Secondary | ICD-10-CM | POA: Diagnosis not present

## 2018-12-05 ENCOUNTER — Ambulatory Visit (INDEPENDENT_AMBULATORY_CARE_PROVIDER_SITE_OTHER): Payer: Medicare HMO

## 2018-12-05 ENCOUNTER — Other Ambulatory Visit: Payer: Self-pay

## 2018-12-05 ENCOUNTER — Ambulatory Visit (INDEPENDENT_AMBULATORY_CARE_PROVIDER_SITE_OTHER): Payer: Medicare HMO | Admitting: Orthopaedic Surgery

## 2018-12-05 ENCOUNTER — Encounter: Payer: Self-pay | Admitting: Orthopaedic Surgery

## 2018-12-05 VITALS — Ht 69.0 in | Wt 186.0 lb

## 2018-12-05 DIAGNOSIS — M25561 Pain in right knee: Secondary | ICD-10-CM

## 2018-12-05 NOTE — Progress Notes (Signed)
Office Visit Note   Patient: Samuel Torres           Date of Birth: March 09, 1933           MRN: [id number] Visit Date: 12/05/2018              Requested by: No referring provider defined for this encounter. PCP: Default, Provider, MD   Assessment & Plan: Visit Diagnoses:  1. Acute pain of right knee     Plan: Impression is acute right knee pain.  His symptoms likely came from overuse and reactive effusion which was symptomatic.  Fortunately this has gotten better significantly on its own.  I recommend taking another couple days off and continue with Advil and ice.  Reassurance was provided that the implant was stable.  I have recommended over-the-counter knee compression sleeve  Follow-Up Instructions: Return if symptoms worsen or fail to improve.   Orders:  Orders Placed This Encounter  Procedures  . XR KNEE 3 VIEW RIGHT   No orders of the defined types were placed in this encounter.     Procedures: No procedures performed   Clinical Data: No additional findings.   Subjective: Chief Complaint  Patient presents with  . Right Knee - Pain    Samuel Torres is a 85 year old gentleman who I have seen in the past for an unrelated issue comes in for evaluation of right knee pain.  He is status post right total knee replacement 2003.  He states that 2 days ago he was doing a lot of yard work and he had a lot of trouble walking and pain the following day.  He had trouble sleeping as well.  He states that progressively has gotten better as he has rested it.  He denies any swelling.  He feels like there is some aching in the joint.  He did notice relief with Advil.   Review of Systems  Constitutional: Negative.   All other systems reviewed and are negative.    Objective: Vital Signs: Ht 5\' 9"  (1.753 m)   Wt 186 lb (84.4 kg)   BMI 27.47 kg/m   Physical Exam Vitals signs and nursing note reviewed.  Constitutional:      Appearance: He is well-developed.  Pulmonary:   Effort: Pulmonary effort is normal.  Abdominal:     Palpations: Abdomen is soft.  Skin:    General: Skin is warm.  Neurological:     Mental Status: He is alert and oriented to person, place, and time.  Psychiatric:        Behavior: Behavior normal.        Thought Content: Thought content normal.        Judgment: Judgment normal.     Ortho Exam Right knee exam shows a fully healed surgical scar.  He has painless full range of motion of the knee.  Collaterals and cruciates are stable.  No joint tenderness.  He has a trace joint effusion. Specialty Comments:  No specialty comments available.  Imaging: Xr Knee 3 View Right  Result Date: 12/05/2018 Stable total knee replacement without complication.    PMFS History: Patient Active Problem List   Diagnosis Date Noted  . Fracture, Colles, left, closed 03/08/2016   Past Medical History:  Diagnosis Date  . Gout   . Hypertension     No family history on file.  Past Surgical History:  Procedure Laterality Date  . COLON SURGERY    . INGUINAL HERNIA REPAIR Left 03/07/2014   Procedure:  LEFT INCARCERATED INGUINAL HERNIA REPAIR WITH MESH ;  Surgeon: Autumn Messing III, MD;  Location: [facility];  Service: General;  Laterality: Left;  . INSERTION OF MESH Left 03/07/2014   Procedure: INSERTION OF MESH;  Surgeon: Autumn Messing III, MD;  Location: [facility];  Service: General;  Laterality: Left;  . KNEE SURGERY Bilateral    Social History   Occupational History  . Not on file  Tobacco Use  . Smoking status: Never Smoker  . Smokeless tobacco: Never Used  Substance and Sexual Activity  . Alcohol use: No  . Drug use: No  . Sexual activity: Not on file

## 2019-01-14 DIAGNOSIS — I10 Essential (primary) hypertension: Secondary | ICD-10-CM | POA: Diagnosis not present

## 2019-01-16 DIAGNOSIS — J310 Chronic rhinitis: Secondary | ICD-10-CM | POA: Diagnosis not present

## 2019-01-16 DIAGNOSIS — E781 Pure hyperglyceridemia: Secondary | ICD-10-CM | POA: Diagnosis not present

## 2019-01-16 DIAGNOSIS — K219 Gastro-esophageal reflux disease without esophagitis: Secondary | ICD-10-CM | POA: Diagnosis not present

## 2019-01-16 DIAGNOSIS — Z0000 Encounter for general adult medical examination without abnormal findings: Secondary | ICD-10-CM | POA: Diagnosis not present

## 2019-01-16 DIAGNOSIS — I10 Essential (primary) hypertension: Secondary | ICD-10-CM | POA: Diagnosis not present

## 2019-01-16 DIAGNOSIS — M109 Gout, unspecified: Secondary | ICD-10-CM | POA: Diagnosis not present

## 2019-01-16 DIAGNOSIS — J4521 Mild intermittent asthma with (acute) exacerbation: Secondary | ICD-10-CM | POA: Diagnosis not present

## 2019-01-23 DIAGNOSIS — I10 Essential (primary) hypertension: Secondary | ICD-10-CM | POA: Diagnosis not present

## 2019-10-24 DIAGNOSIS — K5730 Diverticulosis of large intestine without perforation or abscess without bleeding: Secondary | ICD-10-CM | POA: Diagnosis not present

## 2019-10-24 DIAGNOSIS — K6389 Other specified diseases of intestine: Secondary | ICD-10-CM | POA: Diagnosis not present

## 2019-10-24 DIAGNOSIS — R69 Illness, unspecified: Secondary | ICD-10-CM | POA: Diagnosis not present

## 2020-01-15 DIAGNOSIS — I10 Essential (primary) hypertension: Secondary | ICD-10-CM | POA: Diagnosis not present

## 2020-01-21 DIAGNOSIS — Z0100 Encounter for examination of eyes and vision without abnormal findings: Secondary | ICD-10-CM | POA: Diagnosis not present

## 2020-01-21 DIAGNOSIS — H524 Presbyopia: Secondary | ICD-10-CM | POA: Diagnosis not present

## 2020-01-22 DIAGNOSIS — Z0000 Encounter for general adult medical examination without abnormal findings: Secondary | ICD-10-CM | POA: Diagnosis not present

## 2020-01-22 DIAGNOSIS — K219 Gastro-esophageal reflux disease without esophagitis: Secondary | ICD-10-CM | POA: Diagnosis not present

## 2020-01-22 DIAGNOSIS — Z1212 Encounter for screening for malignant neoplasm of rectum: Secondary | ICD-10-CM | POA: Diagnosis not present

## 2020-01-22 DIAGNOSIS — I10 Essential (primary) hypertension: Secondary | ICD-10-CM | POA: Diagnosis not present

## 2020-01-22 DIAGNOSIS — M1990 Unspecified osteoarthritis, unspecified site: Secondary | ICD-10-CM | POA: Diagnosis not present

## 2020-01-22 DIAGNOSIS — M109 Gout, unspecified: Secondary | ICD-10-CM | POA: Diagnosis not present

## 2020-01-22 DIAGNOSIS — Z6830 Body mass index (BMI) 30.0-30.9, adult: Secondary | ICD-10-CM | POA: Diagnosis not present

## 2020-01-22 DIAGNOSIS — N400 Enlarged prostate without lower urinary tract symptoms: Secondary | ICD-10-CM | POA: Diagnosis not present

## 2020-01-22 DIAGNOSIS — J4521 Mild intermittent asthma with (acute) exacerbation: Secondary | ICD-10-CM | POA: Diagnosis not present

## 2020-01-22 DIAGNOSIS — N1832 Chronic kidney disease, stage 3b: Secondary | ICD-10-CM | POA: Diagnosis not present

## 2021-01-25 DIAGNOSIS — E781 Pure hyperglyceridemia: Secondary | ICD-10-CM | POA: Diagnosis not present

## 2021-01-25 DIAGNOSIS — I10 Essential (primary) hypertension: Secondary | ICD-10-CM | POA: Diagnosis not present

## 2021-01-28 DIAGNOSIS — N1832 Chronic kidney disease, stage 3b: Secondary | ICD-10-CM | POA: Diagnosis not present

## 2021-01-28 DIAGNOSIS — Z0000 Encounter for general adult medical examination without abnormal findings: Secondary | ICD-10-CM | POA: Diagnosis not present

## 2021-01-28 DIAGNOSIS — J4521 Mild intermittent asthma with (acute) exacerbation: Secondary | ICD-10-CM | POA: Diagnosis not present

## 2021-01-28 DIAGNOSIS — M109 Gout, unspecified: Secondary | ICD-10-CM | POA: Diagnosis not present

## 2021-01-28 DIAGNOSIS — K219 Gastro-esophageal reflux disease without esophagitis: Secondary | ICD-10-CM | POA: Diagnosis not present

## 2021-01-28 DIAGNOSIS — D485 Neoplasm of uncertain behavior of skin: Secondary | ICD-10-CM | POA: Diagnosis not present

## 2021-01-28 DIAGNOSIS — Z2821 Immunization not carried out because of patient refusal: Secondary | ICD-10-CM | POA: Diagnosis not present

## 2021-01-28 DIAGNOSIS — N400 Enlarged prostate without lower urinary tract symptoms: Secondary | ICD-10-CM | POA: Diagnosis not present

## 2021-01-28 DIAGNOSIS — I10 Essential (primary) hypertension: Secondary | ICD-10-CM | POA: Diagnosis not present

## 2021-12-21 DIAGNOSIS — C44319 Basal cell carcinoma of skin of other parts of face: Secondary | ICD-10-CM | POA: Diagnosis not present

## 2021-12-21 DIAGNOSIS — L820 Inflamed seborrheic keratosis: Secondary | ICD-10-CM | POA: Diagnosis not present

## 2021-12-21 DIAGNOSIS — C44311 Basal cell carcinoma of skin of nose: Secondary | ICD-10-CM | POA: Diagnosis not present

## 2022-01-24 DIAGNOSIS — C44311 Basal cell carcinoma of skin of nose: Secondary | ICD-10-CM | POA: Diagnosis not present

## 2022-02-16 DIAGNOSIS — C44319 Basal cell carcinoma of skin of other parts of face: Secondary | ICD-10-CM | POA: Diagnosis not present

## 2022-03-22 DIAGNOSIS — L905 Scar conditions and fibrosis of skin: Secondary | ICD-10-CM | POA: Diagnosis not present

## 2022-04-06 DIAGNOSIS — H524 Presbyopia: Secondary | ICD-10-CM | POA: Diagnosis not present

## 2022-04-06 DIAGNOSIS — Z0100 Encounter for examination of eyes and vision without abnormal findings: Secondary | ICD-10-CM | POA: Diagnosis not present

## 2022-04-18 ENCOUNTER — Ambulatory Visit (INDEPENDENT_AMBULATORY_CARE_PROVIDER_SITE_OTHER): Payer: Medicare HMO | Admitting: Orthopedic Surgery

## 2022-04-18 ENCOUNTER — Ambulatory Visit (INDEPENDENT_AMBULATORY_CARE_PROVIDER_SITE_OTHER): Payer: Medicare HMO

## 2022-04-18 ENCOUNTER — Encounter: Payer: Self-pay | Admitting: Orthopedic Surgery

## 2022-04-18 DIAGNOSIS — M79672 Pain in left foot: Secondary | ICD-10-CM | POA: Diagnosis not present

## 2022-04-18 DIAGNOSIS — M79671 Pain in right foot: Secondary | ICD-10-CM | POA: Diagnosis not present

## 2022-04-18 NOTE — Progress Notes (Signed)
Office Visit Note   Patient: Samuel Torres           Date of Birth: May 09, 1933           MRN: [id number] Visit Date: 04/18/2022              Requested by: No referring provider defined for this encounter. PCP: Default, Provider, MD  Chief Complaint  Patient presents with   Right Foot - Pain   Left Foot - Pain      HPI: Patient is a 89 year old gentleman who presents complaining of dorsal midfoot pain bilaterally.  Patient states he has been having pain for about 2 months.  Patient states that he did a lot of work both using a shovel and climbing trees.  Assessment & Plan: Visit Diagnoses:  1. Bilateral foot pain     Plan: Would continue his hard orthotics stiff soled shoes discussed he can try Voltaren gel over the posterior tibial tendon on the right and anterior tibial tendon on the left.  Follow-Up Instructions: No follow-ups on file.   Ortho Exam  Patient is alert, oriented, no adenopathy, well-dressed, normal affect, normal respiratory effort. Examination patient has good pulses bilaterally.  He has tenderness to palpation of the insertion of the right posterior tibial tendon he has slight pronation and valgus of his foot but has good posterior tibial function.  On the left foot he has a small ganglion cyst over the anterior tibial tendon but does have good anterior tibial tendon function.  He has hard orthotics in both shoes.  There is no redness or cellulitis in either foot.  Imaging: XR Foot 2 Views Left  Result Date: 04/18/2022 2 view radiographs of the left foot shows osteoarthritis across the Lisfranc complex with joint space narrowing MTP joint great toe.  XR Foot 2 Views Right  Result Date: 04/18/2022 2 view radiographs of the right foot shows osteoarthritis across the Lisfranc complex as well has joint space narrowing great toe MTP joint.  No images are attached to the encounter.  Labs: Lab Results  Component Value Date   REPTSTATUS 11/26/2013 FINAL  11/24/2013   CULT NO GROWTH Performed at [facility] 11/24/2013     Lab Results  Component Value Date   ALBUMIN 4.4 11/22/2006   ALBUMIN 4.5 11/25/2005    No results found for: "MG" No results found for: "VD25OH"  No results found for: "PREALBUMIN"    Latest Ref Rng & Units 02/27/2014    9:31 AM 11/24/2013   11:07 AM 11/22/2006    1:58 PM  CBC EXTENDED  WBC 4.0 - 10.5 K/uL 5.8  8.3  4.9   RBC 4.22 - 5.81 MIL/uL 4.61  3.84  4.76   Hemoglobin 13.0 - 17.0 g/dL 14.3  12.0  15.1   HCT 39.0 - 52.0 % 42.4  35.2  42.6   Platelets 150 - 400 K/uL 192  272  249   NEUT# 1.7 - 7.7 K/uL  6.4  3.2   Lymph# 0.7 - 4.0 K/uL  1.2  1.3      There is no height or weight on file to calculate BMI.  Orders:  Orders Placed This Encounter  Procedures   XR Foot 2 Views Right   XR Foot 2 Views Left   No orders of the defined types were placed in this encounter.    Procedures: No procedures performed  Clinical Data: No additional findings.  ROS:  All other systems negative, except as  noted in the HPI. Review of Systems  Objective: Vital Signs: There were no vitals taken for this visit.  Specialty Comments:  No specialty comments available.  PMFS History: Patient Active Problem List   Diagnosis Date Noted   Fracture, [place], left, closed 03/08/2016   Past Medical History:  Diagnosis Date   Gout    Hypertension     History reviewed. No pertinent family history.  Past Surgical History:  Procedure Laterality Date   COLON SURGERY     INGUINAL HERNIA REPAIR Left 03/07/2014   Procedure: LEFT INCARCERATED INGUINAL HERNIA REPAIR WITH MESH ;  Surgeon: Autumn Messing III, MD;  Location: [facility];  Service: General;  Laterality: Left;   INSERTION OF MESH Left 03/07/2014   Procedure: INSERTION OF MESH;  Surgeon: Autumn Messing III, MD;  Location: [facility];  Service: General;  Laterality: Left;   KNEE SURGERY Bilateral    Social History   Occupational History   Not on file  Tobacco Use    Smoking status: Never   Smokeless tobacco: Never  Substance and Sexual Activity   Alcohol use: No   Drug use: No   Sexual activity: Not on file

## 2022-04-20 DIAGNOSIS — L905 Scar conditions and fibrosis of skin: Secondary | ICD-10-CM | POA: Diagnosis not present

## 2022-04-20 DIAGNOSIS — D485 Neoplasm of uncertain behavior of skin: Secondary | ICD-10-CM | POA: Diagnosis not present

## 2022-06-03 DIAGNOSIS — R7303 Prediabetes: Secondary | ICD-10-CM | POA: Diagnosis not present

## 2022-06-03 DIAGNOSIS — I10 Essential (primary) hypertension: Secondary | ICD-10-CM | POA: Diagnosis not present

## 2022-06-17 DIAGNOSIS — K219 Gastro-esophageal reflux disease without esophagitis: Secondary | ICD-10-CM | POA: Diagnosis not present

## 2022-06-17 DIAGNOSIS — I10 Essential (primary) hypertension: Secondary | ICD-10-CM | POA: Diagnosis not present

## 2022-06-17 DIAGNOSIS — R7303 Prediabetes: Secondary | ICD-10-CM | POA: Diagnosis not present

## 2022-06-17 DIAGNOSIS — N1831 Chronic kidney disease, stage 3a: Secondary | ICD-10-CM | POA: Diagnosis not present

## 2022-06-17 DIAGNOSIS — N400 Enlarged prostate without lower urinary tract symptoms: Secondary | ICD-10-CM | POA: Diagnosis not present

## 2022-06-17 DIAGNOSIS — M109 Gout, unspecified: Secondary | ICD-10-CM | POA: Diagnosis not present

## 2022-06-17 DIAGNOSIS — Z0000 Encounter for general adult medical examination without abnormal findings: Secondary | ICD-10-CM | POA: Diagnosis not present

## 2022-07-25 DIAGNOSIS — M76821 Posterior tibial tendinitis, right leg: Secondary | ICD-10-CM | POA: Diagnosis not present

## 2023-06-19 DIAGNOSIS — R7303 Prediabetes: Secondary | ICD-10-CM | POA: Diagnosis not present

## 2023-06-19 DIAGNOSIS — I10 Essential (primary) hypertension: Secondary | ICD-10-CM | POA: Diagnosis not present

## 2023-06-22 DIAGNOSIS — K219 Gastro-esophageal reflux disease without esophagitis: Secondary | ICD-10-CM | POA: Diagnosis not present

## 2023-06-22 DIAGNOSIS — N138 Other obstructive and reflux uropathy: Secondary | ICD-10-CM | POA: Diagnosis not present

## 2023-06-22 DIAGNOSIS — M109 Gout, unspecified: Secondary | ICD-10-CM | POA: Diagnosis not present

## 2023-06-22 DIAGNOSIS — I10 Essential (primary) hypertension: Secondary | ICD-10-CM | POA: Diagnosis not present

## 2023-06-22 DIAGNOSIS — R7303 Prediabetes: Secondary | ICD-10-CM | POA: Diagnosis not present

## 2023-06-22 DIAGNOSIS — N401 Enlarged prostate with lower urinary tract symptoms: Secondary | ICD-10-CM | POA: Diagnosis not present

## 2023-06-22 DIAGNOSIS — R748 Abnormal levels of other serum enzymes: Secondary | ICD-10-CM | POA: Diagnosis not present

## 2023-06-22 DIAGNOSIS — Z0000 Encounter for general adult medical examination without abnormal findings: Secondary | ICD-10-CM | POA: Diagnosis not present

## 2023-06-22 DIAGNOSIS — N1832 Chronic kidney disease, stage 3b: Secondary | ICD-10-CM | POA: Diagnosis not present

## 2023-06-22 DIAGNOSIS — J310 Chronic rhinitis: Secondary | ICD-10-CM | POA: Diagnosis not present

## 2023-06-28 DIAGNOSIS — R748 Abnormal levels of other serum enzymes: Secondary | ICD-10-CM | POA: Diagnosis not present

## 2023-06-28 DIAGNOSIS — K7689 Other specified diseases of liver: Secondary | ICD-10-CM | POA: Diagnosis not present

## 2023-09-28 DIAGNOSIS — I10 Essential (primary) hypertension: Secondary | ICD-10-CM | POA: Diagnosis not present

## 2023-09-28 DIAGNOSIS — R7303 Prediabetes: Secondary | ICD-10-CM | POA: Diagnosis not present

## 2023-10-12 DIAGNOSIS — R053 Chronic cough: Secondary | ICD-10-CM | POA: Diagnosis not present

## 2023-10-12 DIAGNOSIS — K769 Liver disease, unspecified: Secondary | ICD-10-CM | POA: Diagnosis not present
